# Patient Record
Sex: Female | Born: 1945
Health system: Southern US, Community
[De-identification: ages and names within clinical notes are randomized; demographics above are authoritative.]

## PROBLEM LIST (undated history)

## (undated) DIAGNOSIS — I4891 Unspecified atrial fibrillation: Secondary | ICD-10-CM

## (undated) DIAGNOSIS — T7840XA Allergy, unspecified, initial encounter: Secondary | ICD-10-CM

## (undated) DIAGNOSIS — J4520 Mild intermittent asthma, uncomplicated: Secondary | ICD-10-CM

## (undated) HISTORY — DX: Mild intermittent asthma, uncomplicated: J45.20

## (undated) HISTORY — DX: Unspecified atrial fibrillation: I48.91

## (undated) HISTORY — DX: Allergy, unspecified, initial encounter: T78.40XA

## (undated) HISTORY — PX: ABDOMINAL HYSTERECTOMY: SHX81

---

## 2001-04-10 ENCOUNTER — Other Ambulatory Visit: Admission: RE | Admit: 2001-04-10 | Discharge: 2001-04-10 | Payer: Self-pay | Admitting: Obstetrics & Gynecology

## 2002-05-13 ENCOUNTER — Other Ambulatory Visit: Admission: RE | Admit: 2002-05-13 | Discharge: 2002-05-13 | Payer: Self-pay | Admitting: Obstetrics & Gynecology

## 2003-05-26 ENCOUNTER — Other Ambulatory Visit: Admission: RE | Admit: 2003-05-26 | Discharge: 2003-05-26 | Payer: Self-pay | Admitting: Obstetrics & Gynecology

## 2004-06-29 ENCOUNTER — Other Ambulatory Visit: Admission: RE | Admit: 2004-06-29 | Discharge: 2004-06-29 | Payer: Self-pay | Admitting: Obstetrics & Gynecology

## 2005-08-02 ENCOUNTER — Other Ambulatory Visit: Admission: RE | Admit: 2005-08-02 | Discharge: 2005-08-02 | Payer: Self-pay | Admitting: Obstetrics & Gynecology

## 2005-08-16 ENCOUNTER — Ambulatory Visit: Payer: Self-pay | Admitting: Internal Medicine

## 2005-08-30 ENCOUNTER — Ambulatory Visit: Payer: Self-pay | Admitting: Internal Medicine

## 2006-10-26 ENCOUNTER — Ambulatory Visit: Payer: Self-pay | Admitting: Family Medicine

## 2008-01-10 ENCOUNTER — Ambulatory Visit: Payer: Self-pay | Admitting: Family Medicine

## 2009-12-28 ENCOUNTER — Ambulatory Visit: Payer: Self-pay | Admitting: Family Medicine

## 2009-12-28 ENCOUNTER — Encounter: Admission: RE | Admit: 2009-12-28 | Discharge: 2009-12-28 | Payer: Self-pay | Admitting: Family Medicine

## 2010-01-12 ENCOUNTER — Ambulatory Visit: Payer: Self-pay | Admitting: Family Medicine

## 2010-08-19 ENCOUNTER — Ambulatory Visit (INDEPENDENT_AMBULATORY_CARE_PROVIDER_SITE_OTHER): Payer: BC Managed Care – PPO | Admitting: Family Medicine

## 2010-08-19 DIAGNOSIS — J209 Acute bronchitis, unspecified: Secondary | ICD-10-CM

## 2011-09-26 ENCOUNTER — Ambulatory Visit (INDEPENDENT_AMBULATORY_CARE_PROVIDER_SITE_OTHER): Payer: BC Managed Care – PPO | Admitting: Family Medicine

## 2011-09-26 ENCOUNTER — Encounter: Payer: Self-pay | Admitting: Family Medicine

## 2011-09-26 DIAGNOSIS — J301 Allergic rhinitis due to pollen: Secondary | ICD-10-CM

## 2011-09-26 DIAGNOSIS — J019 Acute sinusitis, unspecified: Secondary | ICD-10-CM

## 2011-09-26 DIAGNOSIS — J45901 Unspecified asthma with (acute) exacerbation: Secondary | ICD-10-CM

## 2011-09-26 MED ORDER — ALBUTEROL SULFATE HFA 108 (90 BASE) MCG/ACT IN AERS: 2.0000 | INHALATION_SPRAY | Freq: Four times a day (QID) | RESPIRATORY_TRACT | Status: DC | PRN

## 2011-09-26 MED ORDER — AMOXICILLIN-POT CLAVULANATE 875-125 MG PO TABS: 1.0000 | ORAL_TABLET | Freq: Two times a day (BID) | ORAL | Status: AC

## 2011-09-26 NOTE — Progress Notes (Signed)
  Subjective:    Patient ID: Alexandria [name], female    DOB: 08-26-1945, 66 y.o.   MRN: [id number]  HPI Approximately 3 weeks ago she started having difficulty with sneezing, itchy watery eyes and rhinorrhea. 2 weeks ago she noted the onset of left earache and was seen in an urgent care center. She was given azithromycin which did not help. She continues to have difficulty with bilateral earache, cough, PND, slight sore throat with chest congestion she has a previous history of asthma and has been seen by an allergist and did have skin testing done. She does not smoke.   Review of Systems     Objective:   Physical Exam alert and in no distress. Tympanic membranes and canals are normal. Throat is clear. Tonsils are normal. Neck is supple without adenopathy or thyromegaly. Cardiac exam shows a regular sinus rhythm without murmurs or gallops. Lungs are clear to auscultation. His enclosed is boggy but nontender sinuses        Assessment & Plan:   1. Acute sinusitis  amoxicillin-clavulanate (AUGMENTIN) 875-125 MG per tablet  2. Allergic rhinitis due to pollen    3. Asthma flare  albuterol (PROVENTIL HFA) 108 (90 BASE) MCG/ACT inhaler   she will call if continued difficulty.

## 2011-09-26 NOTE — Patient Instructions (Signed)
Take all of the antibiotic and if not totally back to normal when you finish, give me a call. If your allergies give you more trouble , set up an appointment for further care.

## 2011-09-28 ENCOUNTER — Telehealth: Payer: Self-pay | Admitting: Family Medicine

## 2011-09-28 NOTE — Telephone Encounter (Signed)
PT CALLED AND STATED THAT RX SHE WAS GIVEN ON Monday IS REALLY UPSETTING HER STOMACH. SHE DOESN'T WANT TO TAKE ANYMORE, SHE WOULD LIKE SOMETHING ELSE CALLED IN. PT ALSO STATES THAT HER EAR IS ITCHING AND SHE WOULD LIKE TO PUT PEROXIDE IN HER EAR. SHE WOULD LIKE TO KNOW  IF THAT IS OK. PLEASE CALL PT. PT USES [facility] ON [address].

## 2011-09-29 MED ORDER — LEVOFLOXACIN 500 MG PO TABS: 500.0000 mg | ORAL_TABLET | Freq: Every day | ORAL | Status: AC

## 2011-09-29 NOTE — Telephone Encounter (Signed)
I called and lmom for the patient about her medication change and the peroxide in her ear. CLS

## 2011-09-29 NOTE — Telephone Encounter (Signed)
N: The different medication. Let her know that proximal I probably won't do any good

## 2011-09-29 NOTE — Telephone Encounter (Signed)
Patient switched to Levaquin due to GI upset

## 2011-09-29 NOTE — Telephone Encounter (Signed)
Alexandria [name] please handle

## 2013-12-23 ENCOUNTER — Encounter: Payer: Self-pay | Admitting: Family Medicine

## 2013-12-23 ENCOUNTER — Ambulatory Visit (INDEPENDENT_AMBULATORY_CARE_PROVIDER_SITE_OTHER): Payer: Medicare Other | Admitting: Family Medicine

## 2013-12-23 VITALS — BP 120/70 | HR 100 | Temp 98.7°F | Wt 163.0 lb

## 2013-12-23 DIAGNOSIS — J029 Acute pharyngitis, unspecified: Secondary | ICD-10-CM

## 2013-12-23 DIAGNOSIS — H6120 Impacted cerumen, unspecified ear: Secondary | ICD-10-CM

## 2013-12-23 NOTE — Progress Notes (Deleted)
   Subjective:    Patient ID: Alexandria [name], female    DOB: [birth year], 68 y.o.   MRN: [id number]  HPI    Review of Systems     Objective:   Physical Exam        Assessment & Plan:

## 2013-12-23 NOTE — Progress Notes (Signed)
   Subjective:    Patient ID: Alexandria [name], female    DOB: 05/08/1946, 68 y.o.   MRN: [id number]  HPI  Patient is presenting for 5 day history of throat and left ear popping. Associated symptoms include dry cough, wheezing, feeling feverish (99.50F this morning), pain in her back and shoulder, feels uncomfortable breathing. She has a previous history of difficulty with asthma. She denies ear drainage, decreased hearing, ear pain, sinus pressure, congestion, headaches, myalgias, nausea, vomiting, diarrhea, abdominal pain, chest pain, difficulty swallowing, hoarse voice.   For her left ear, patient has tried to use peroxide and sweet oil with a cotton swab her left ear without symptom relief. For her throat she tried Advil without symptom relief. For her breathing, she has not tried any interventions including her albuterol.   Of note, she has had sick contacts at a music camp with children last week. She also admits having had a history of bronchitis and pneumonia which she struggled to resolve.  She denies any other questions or concerns.  Review of Systems As in subjective.     Objective:   Physical Exam  BP 120/70  Pulse 100  Temp(Src) 98.7 F (37.1 C) (Oral)  Wt 163 lb (73.936 kg)  SpO2 98%  General appearence: alert, no distress, WD/WN HEENT: normocephalic, sclerae anicteric, conjunctiva pink and moist, TMs pearly and intact, TMs non-erythematous without drainage or bulging, mild cerumen impaction in left ear, nares patent, no discharge or erythema, pharynx with mild post-nasal drip, tonsils absent (s/p tonsillectomy) Oral cavity: MMM, no lesions Neck: supple, no lymphadenopathy, no thyromegaly, no masses Heart: RRR, normal S1, S2, no murmurs Lungs: CTA bilaterally, no wheezes, rhonchi, or rales       Assessment & Plan:   Acute pharyngitis, unspecified pharyngitis type - Plan: Rapid Strep A Rapid strep was negative. Advised rest, fluids and hydration. Return if symptoms  do not resolve or worsen in the next few days.  Cerumen impaction in left ear - irrigated and manually removed from a left and right ear canals  Patient was seen in conjunction with PA student Alexandria [name], and I have also evaluated and examined patient, agree with student's notes, student supervised by me.

## 2014-01-06 LAB — HM DEXA SCAN: HM DEXA SCAN: NORMAL

## 2015-06-10 LAB — HM MAMMOGRAPHY

## 2015-08-21 ENCOUNTER — Encounter: Payer: Self-pay | Admitting: Gastroenterology

## 2015-09-23 ENCOUNTER — Telehealth: Payer: Self-pay | Admitting: Family Medicine

## 2015-09-23 NOTE — Telephone Encounter (Signed)
Left message for pt to call/ needs cpe

## 2015-10-18 DIAGNOSIS — J00 Acute nasopharyngitis [common cold]: Secondary | ICD-10-CM | POA: Diagnosis not present

## 2015-10-18 DIAGNOSIS — J069 Acute upper respiratory infection, unspecified: Secondary | ICD-10-CM | POA: Diagnosis not present

## 2016-01-05 DIAGNOSIS — Z1272 Encounter for screening for malignant neoplasm of vagina: Secondary | ICD-10-CM | POA: Diagnosis not present

## 2016-01-05 DIAGNOSIS — Z01419 Encounter for gynecological examination (general) (routine) without abnormal findings: Secondary | ICD-10-CM | POA: Diagnosis not present

## 2016-01-05 DIAGNOSIS — Z6827 Body mass index (BMI) 27.0-27.9, adult: Secondary | ICD-10-CM | POA: Diagnosis not present

## 2016-01-12 ENCOUNTER — Encounter: Payer: Self-pay | Admitting: Gastroenterology

## 2016-02-01 ENCOUNTER — Ambulatory Visit (INDEPENDENT_AMBULATORY_CARE_PROVIDER_SITE_OTHER): Payer: Medicare Other | Admitting: Family Medicine

## 2016-02-01 ENCOUNTER — Encounter: Payer: Self-pay | Admitting: Family Medicine

## 2016-02-01 VITALS — BP 122/80 | HR 80 | Ht 63.5 in | Wt 162.0 lb

## 2016-02-01 DIAGNOSIS — J301 Allergic rhinitis due to pollen: Secondary | ICD-10-CM

## 2016-02-01 DIAGNOSIS — Z1159 Encounter for screening for other viral diseases: Secondary | ICD-10-CM | POA: Diagnosis not present

## 2016-02-01 DIAGNOSIS — Z139 Encounter for screening, unspecified: Secondary | ICD-10-CM | POA: Diagnosis not present

## 2016-02-01 DIAGNOSIS — J4520 Mild intermittent asthma, uncomplicated: Secondary | ICD-10-CM

## 2016-02-01 DIAGNOSIS — Z79890 Hormone replacement therapy (postmenopausal): Secondary | ICD-10-CM | POA: Diagnosis not present

## 2016-02-01 DIAGNOSIS — E785 Hyperlipidemia, unspecified: Secondary | ICD-10-CM | POA: Insufficient documentation

## 2016-02-01 DIAGNOSIS — Z23 Encounter for immunization: Secondary | ICD-10-CM

## 2016-02-01 DIAGNOSIS — Z1211 Encounter for screening for malignant neoplasm of colon: Secondary | ICD-10-CM

## 2016-02-01 DIAGNOSIS — Z0000 Encounter for general adult medical examination without abnormal findings: Secondary | ICD-10-CM | POA: Diagnosis not present

## 2016-02-01 HISTORY — DX: Mild intermittent asthma, uncomplicated: J45.20

## 2016-02-01 LAB — COMPREHENSIVE METABOLIC PANEL
ALT: 16 U/L (ref 6–29)
AST: 18 U/L (ref 10–35)
Albumin: 3.9 g/dL (ref 3.6–5.1)
Alkaline Phosphatase: 47 U/L (ref 33–130)
BILIRUBIN TOTAL: 0.5 mg/dL (ref 0.2–1.2)
BUN: 18 mg/dL (ref 7–25)
CALCIUM: 9.2 mg/dL (ref 8.6–10.4)
CHLORIDE: 102 mmol/L (ref 98–110)
CO2: 27 mmol/L (ref 20–31)
Creat: 0.77 mg/dL (ref 0.60–0.93)
GLUCOSE: 83 mg/dL (ref 65–99)
Potassium: 4.7 mmol/L (ref 3.5–5.3)
Sodium: 138 mmol/L (ref 135–146)
Total Protein: 6.6 g/dL (ref 6.1–8.1)

## 2016-02-01 LAB — LIPID PANEL
CHOL/HDL RATIO: 4 ratio (ref <=5.0)
Cholesterol: 205 mg/dL — ABNORMAL HIGH (ref 125–200)
HDL: 51 mg/dL (ref >=46)
LDL CALC: 135 mg/dL — AB (ref <130)
TRIGLYCERIDES: 93 mg/dL (ref <150)
VLDL: 19 mg/dL (ref <30)

## 2016-02-01 LAB — CBC WITH DIFFERENTIAL/PLATELET
BASOS ABS: 0 {cells}/uL (ref 0–200)
BASOS PCT: 0 %
EOS ABS: 180 {cells}/uL (ref 15–500)
EOS PCT: 3 %
HEMATOCRIT: 45.3 % — AB (ref 35.0–45.0)
Hemoglobin: 15.4 g/dL (ref 11.7–15.5)
LYMPHS ABS: 2040 {cells}/uL (ref 850–3900)
MCH: 29.9 pg (ref 27.0–33.0)
MCHC: 34 g/dL (ref 32.0–36.0)
MCV: 88 fL (ref 80.0–100.0)
MPV: 9.7 fL (ref 7.5–12.5)
Monocytes Absolute: 360 cells/uL (ref 200–950)
Monocytes Relative: 6 %
NEUTROS PCT: 57 %
Neutro Abs: 3420 cells/uL (ref 1500–7800)
Platelets: 287 10*3/uL (ref 140–400)
RBC: 5.15 MIL/uL — ABNORMAL HIGH (ref 3.80–5.10)
RDW: 13.3 % (ref 11.0–15.0)
WBC: 6 10*3/uL (ref 4.0–10.5)

## 2016-02-01 NOTE — Progress Notes (Signed)
Subjective:   HPI  Alexandria [name] is a 70 y.o. female who presents for a complete physical.  Medical care team includes:  Dr.Neal  Dr.Kraska   Preventative care Last ophthalmology visit: dec 2016 [facility] Last dental visit: 2/17 02/11/16 Last colonoscopy: 08/20/2005 Last mammogram:06/10/15 Last gynecological exam: 7/17 Last EKG:?Several years ago Last labs: 2015  Prior vaccinations: TD or Tdap:allergies and she apparently had an adverse reaction as a child. Influenza: 9/16 Pneumococcal: 23:2012 Shingles/Zostavax: had the shot   Advanced directive: Yes Health care power of attorney: Yes  Concerns:She complains of a year-long history of coughing that has not gotten worse. She does note that when she lies down or gets up, she will have more trouble with hiccups She also coughs but cannot relate these to the hiccups. She does have underlying allergies and is taking an OTC medication for this. She also complains of difficulty hearing. She is on HRT from her gynecologist. She does occasionally have difficulty with asthma and does use an albuterol inhaler on an as-needed basis.  Reviewed their medical, surgical, family, social, medication, and allergy history and updated chart as appropriate.    Review of Systems Constitutional: -fever, -chills, -sweats, -unexpected weight change, -decreased appetite, -fatigue Allergy: --congestion Dermatology: -changing moles, --rash, -lumps ENT: -runny nose, -ear pain, -sore throat, -hoarseness, -sinus pain, -teeth pain, - ringing in ears, -hearing loss, -nosebleeds Cardiology: -chest pain, -palpitations, -swelling, -difficulty breathing when lying flat, -waking up short of breath Respiratory: -cough, -shortness of breath, -difficulty breathing with exercise or exertion, -wheezing, -coughing up blood Gastroenterology: -abdominal pain, -nausea, -vomiting, -diarrhea, -constipation, -blood in stool, -changes in bowel movement, -difficulty swallowing  or eating Hematology: -bleeding, -bruising  Musculoskeletal: -joint aches, -muscle aches, -joint swelling, -back pain, -neck pain, -cramping, -changes in gait Ophthalmology: denies vision changes, eye redness, itching, discharge Urology: -burning with urination, -difficulty urinating, -blood in urine, -urinary frequency, -urgency, -incontinence Neurology: -headache, -weakness, -tingling, -numbness, -memory loss, -falls, -dizziness Psychology: -depressed mood, -agitation, -sleep problems     Objective:   Physical Exam General appearance: alert, no distress, WD/WN,  Skin: Normal HEENT: normocephalic, conjunctiva/corneas normal, sclerae anicteric, PERRLA, EOMi, nares patent, no discharge or erythema, pharynx normal. Both ears were lavaged to remove all cerumen. Oral cavity: MMM, tongue normal, teeth normal Neck: supple, no lymphadenopathy, no thyromegaly, no masses, normal ROM Chest: non tender, normal shape and expansion Heart: RRR, normal S1, S2, no murmurs Lungs: CTA bilaterally, no wheezes, rhonchi, or rales Abdomen: +bs, soft, non tender, non distended, no masses, no hepatomegaly, no splenomegaly, no bruits Back: non tender, normal ROM, no scoliosis Musculoskeletal: upper extremities non tender, no obvious deformity, normal ROM throughout, lower extremities non tender, no obvious deformity, normal ROM throughout Extremities: no edema, no cyanosis, no clubbing Pulses: 2+ symmetric, upper and lower extremities, normal cap refill Neurological: alert, oriented x 3, CN2-12 intact, strength normal upper extremities and lower extremities, sensation normal throughout, DTRs 2+ throughout, no cerebellar signs, gait normal Psychiatric: normal affect, behavior normal, pleasant  Breast: nontender, no masses or lumps, no skin changes, no nipple discharge or inversion, no axillary lymphadenopathy    Assessment and Plan :   Non-seasonal allergic rhinitis due to pollen - Plan: CBC with  Differential/Platelet, Comprehensive metabolic panel  Asthma, mild intermittent, uncomplicated - Plan: CBC with Differential/Platelet, Comprehensive metabolic panel  Need for prophylactic vaccination against Streptococcus pneumoniae (pneumococcus) - Plan: Pneumococcal conjugate vaccine 13-valent  Screening for colon cancer - Plan: CBC with Differential/Platelet, Comprehensive metabolic panel, Cologuard  Borderline hyperlipidemia -  Plan: CBC with Differential/Platelet, Comprehensive metabolic panel, Lipid panel  Hormone replacement therapy (HRT)  Need for hepatitis C screening test - Plan: Hepatitis C antibody Cologuard ordered as well.     Physical exam - discussed healthy lifestyle, diet, exercise, preventative care, vaccinations, and addressed their concerns.   Follow-up as needed

## 2016-02-02 DIAGNOSIS — M21611 Bunion of right foot: Secondary | ICD-10-CM | POA: Diagnosis not present

## 2016-02-02 DIAGNOSIS — M2041 Other hammer toe(s) (acquired), right foot: Secondary | ICD-10-CM | POA: Diagnosis not present

## 2016-02-02 LAB — HEPATITIS C ANTIBODY: HCV Ab: NEGATIVE

## 2016-02-08 ENCOUNTER — Encounter: Payer: Self-pay | Admitting: Family Medicine

## 2016-02-09 DIAGNOSIS — Z1212 Encounter for screening for malignant neoplasm of rectum: Secondary | ICD-10-CM | POA: Diagnosis not present

## 2016-02-09 DIAGNOSIS — Z1211 Encounter for screening for malignant neoplasm of colon: Secondary | ICD-10-CM | POA: Diagnosis not present

## 2016-02-19 LAB — COLOGUARD: Cologuard: NEGATIVE

## 2016-03-15 ENCOUNTER — Ambulatory Visit (INDEPENDENT_AMBULATORY_CARE_PROVIDER_SITE_OTHER): Payer: Medicare Other | Admitting: Medical

## 2016-03-15 ENCOUNTER — Ambulatory Visit
Admission: RE | Admit: 2016-03-15 | Discharge: 2016-03-15 | Disposition: A | Discharge status: Home / Self Care | Payer: Medicare Other | Source: Ambulatory Visit | Attending: Medical | Admitting: Medical

## 2016-03-15 VITALS — BP 122/82 | HR 72 | Temp 97.8°F | Ht 63.5 in | Wt 157.6 lb

## 2016-03-15 DIAGNOSIS — R059 Cough, unspecified: Secondary | ICD-10-CM | POA: Insufficient documentation

## 2016-03-15 DIAGNOSIS — Z23 Encounter for immunization: Secondary | ICD-10-CM | POA: Insufficient documentation

## 2016-03-15 DIAGNOSIS — R918 Other nonspecific abnormal finding of lung field: Secondary | ICD-10-CM | POA: Insufficient documentation

## 2016-03-15 DIAGNOSIS — R05 Cough: Secondary | ICD-10-CM | POA: Diagnosis not present

## 2016-03-15 MED ORDER — ALBUTEROL SULFATE HFA 108 (90 BASE) MCG/ACT IN AERS: 2.0000 | INHALATION_SPRAY | Freq: Four times a day (QID) | RESPIRATORY_TRACT | 0 refills | Status: DC | PRN

## 2016-03-15 MED ORDER — HYDROCODONE-HOMATROPINE 5-1.5 MG/5ML PO SYRP: 5.0000 mL | ORAL_SOLUTION | Freq: Three times a day (TID) | ORAL | 0 refills | Status: DC | PRN

## 2016-03-15 MED ORDER — LEVOFLOXACIN 500 MG PO TABS: 500.0000 mg | ORAL_TABLET | Freq: Every day | ORAL | 0 refills | Status: DC

## 2016-03-15 NOTE — Progress Notes (Signed)
Subjective:  Alexandria [name] is a 70 y.o. female who presents for cough, congestion.   She reports almost a week history lots of cough, congestion, and dentist gave her Zpak for illness.   Hasn't slept flat all week due to cough, been using sudafed as well, nasal saline.   Blowing nose constantly, coughing a lots. Thinks she has had bronchitis.  No fever, no sore throat, no ear pain, no teeth pain related to sinuses.  Has felt some wheezing, some SOB.   Had similar in the spring, thinks it started with allergies, but this quickly turned into sickness.   Patient is not a smoker.   No recent sick contacts.  No other aggravating or relieving factors.  No other c/o.  The following portions of the patient's history were reviewed and updated as appropriate: allergies, current medications, past family history, past medical history, past social history, past surgical history and problem list.  ROS as in subjective  Past Medical History:  Diagnosis Date  . Allergy   . Asthma, mild intermittent 02/01/2016   Current Outpatient Prescriptions on File Prior to Visit  Medication Sig Dispense Refill  . Biotin 5000 MCG CAPS Take by mouth.    . calcium carbonate (OSCAL) 1500 (600 Ca) MG TABS tablet Take 600 mg of elemental calcium by mouth 2 (two) times daily with a meal.    . cholecalciferol (VITAMIN D) 1000 units tablet Take 2,000 Units by mouth daily.    Marland Kitchen. estradiol (VIVELLE-DOT) 0.1 MG/24HR patch Place 1 patch onto the skin once a week.     . pyridOXINE (VITAMIN B-6) 100 MG tablet Take 100 mg by mouth daily.    . vitamin C (ASCORBIC ACID) 500 MG tablet Take 500 mg by mouth daily.     No current facility-administered medications on file prior to visit.      Objective: BP 122/82 (BP Location: Right Arm, Patient Position: Sitting, Cuff Size: Normal)   Pulse 72   Temp 97.8 F (36.6 C) (Oral)   Ht 5' 3.5" (1.613 m)   Wt 157 lb 9.6 oz (71.5 kg)   SpO2 98%   BMI 27.48 kg/m   General appearance:  Alert, WD/WN, no distress,somewhat ill appearing                             Skin: warm, no rash, no diaphoresis                           Head: no sinus tenderness                            Eyes: conjunctiva normal, corneas clear, PERRLA                            Ears: pearly TMs, external ear canals normal                          Nose: septum midline, turbinates swollen, with erythema and clear discharge             Mouth/throat: MMM, tongue normal, mild pharyngeal erythema                           Neck: supple, no adenopathy, no thyromegaly, nontender  Heart: RRR, normal S1, S2, no murmurs                         Lungs: +bronchial breath sounds, decreased right lower field sounds, dullness, but no wheezes, no rales                Extremities: no edema, nontender      Assessment: Encounter Diagnoses  Name Primary?  . Cough Yes  . Abnormal lung field   . Need for prophylactic vaccination and inoculation against influenza      Plan:  Discussed exam findings.   Will send for xray.   Can use Hycodan syrup for worse cough, begin albuterol, Levaquin sent.  Rest, hydrate well.    F/u pending xray Counseled on the influenza virus vaccine.  Vaccine information sheet given.  Influenza vaccine given after consent obtained.  Troy was seen today for cough.  Diagnoses and all orders for this visit:  Cough -     DG Chest 2 View; Future -     Pulse oximetry (single); Future  Abnormal lung field -     DG Chest 2 View; Future -     Pulse oximetry (single); Future  Need for prophylactic vaccination and inoculation against influenza -     Flu Vaccine QUAD 36+ mos IM  Other orders -     HYDROcodone-homatropine (HYCODAN) 5-1.5 MG/5ML syrup; Take 5 mLs by mouth every 8 (eight) hours as needed for cough. -     albuterol (PROVENTIL HFA;VENTOLIN HFA) 108 (90 Base) MCG/ACT inhaler; Inhale 2 puffs into the lungs every 6 (six) hours as needed for wheezing or shortness  of breath. -     levofloxacin (LEVAQUIN) 500 MG tablet; Take 1 tablet (500 mg total) by mouth daily.

## 2016-03-21 ENCOUNTER — Encounter: Payer: Self-pay | Admitting: Gastroenterology

## 2016-05-27 DIAGNOSIS — H349 Unspecified retinal vascular occlusion: Secondary | ICD-10-CM | POA: Diagnosis not present

## 2016-05-27 DIAGNOSIS — H25043 Posterior subcapsular polar age-related cataract, bilateral: Secondary | ICD-10-CM | POA: Diagnosis not present

## 2016-06-09 DIAGNOSIS — Z1231 Encounter for screening mammogram for malignant neoplasm of breast: Secondary | ICD-10-CM | POA: Diagnosis not present

## 2016-06-09 DIAGNOSIS — Z803 Family history of malignant neoplasm of breast: Secondary | ICD-10-CM | POA: Diagnosis not present

## 2016-06-09 LAB — HM MAMMOGRAPHY

## 2016-06-15 ENCOUNTER — Encounter: Payer: Self-pay | Admitting: Family Medicine

## 2016-06-30 DIAGNOSIS — H25041 Posterior subcapsular polar age-related cataract, right eye: Secondary | ICD-10-CM | POA: Diagnosis not present

## 2016-06-30 DIAGNOSIS — H25811 Combined forms of age-related cataract, right eye: Secondary | ICD-10-CM | POA: Diagnosis not present

## 2016-06-30 DIAGNOSIS — H268 Other specified cataract: Secondary | ICD-10-CM | POA: Diagnosis not present

## 2016-07-14 DIAGNOSIS — H25042 Posterior subcapsular polar age-related cataract, left eye: Secondary | ICD-10-CM | POA: Diagnosis not present

## 2016-07-14 DIAGNOSIS — H268 Other specified cataract: Secondary | ICD-10-CM | POA: Diagnosis not present

## 2016-07-14 DIAGNOSIS — H25812 Combined forms of age-related cataract, left eye: Secondary | ICD-10-CM | POA: Diagnosis not present

## 2016-07-15 ENCOUNTER — Encounter: Payer: Self-pay | Admitting: Family Medicine

## 2016-07-15 ENCOUNTER — Ambulatory Visit (INDEPENDENT_AMBULATORY_CARE_PROVIDER_SITE_OTHER): Payer: Medicare Other | Admitting: Family Medicine

## 2016-07-15 VITALS — BP 120/70 | HR 74 | Temp 98.1°F

## 2016-07-15 DIAGNOSIS — M545 Low back pain, unspecified: Secondary | ICD-10-CM

## 2016-07-15 MED ORDER — CYCLOBENZAPRINE HCL 5 MG PO TABS: 5.0000 mg | ORAL_TABLET | Freq: Every day | ORAL | 0 refills | Status: DC

## 2016-07-15 MED ORDER — CYCLOBENZAPRINE HCL 5 MG PO TABS
5.0000 mg | ORAL_TABLET | Freq: Every day | ORAL | 0 refills | Status: DC
Start: 2016-07-15 — End: 2017-03-24

## 2016-07-15 NOTE — Patient Instructions (Signed)
Keep using heat and doing some gentle stretches. Take the flexeril at bedtime as needed but use caution as this medication will make you sleepy.

## 2016-07-15 NOTE — Progress Notes (Signed)
   Subjective:    Patient ID: Alexandria [name], female    DOB: 1945-07-01, 71 y.o.   MRN: [id number]  HPI Chief Complaint  Patient presents with  . back pain    back pain- last friday, cleaning bathroom and thinks she twisted cleaning. been taking ibuprofen and its not touching it, wants a muscle relaxer   She is here with complaints of a 6 day history of left low back pain that started after cleaning her bathroom and being bent over for several minutes. She also lifted something heavy that she thinks made the pain worse. Pain is non radiating. Worse with going from sitting to standing. Nothing makes pain better.  She has used heat and tylenol 500 mg and ibuprofen 400 mg.  She had cataract surgery yesterday and states she cannot take as much ibuprofen.   Denies numbness, tingling, weakness. No loss of control of bowels or bladder. Denies fever, chills, abdominal pain, nausea, vomiting, diarrhea.   Thinks she is also getting a cold since yesterday she has had rhinorrhea and some nasal congestion.   Reviewed allergies, medications, past medical, and social history.    Review of Systems Pertinent positives and negatives in the history of present illness.     Objective:   Physical Exam  Constitutional: She is oriented to person, place, and time. She appears well-developed and well-nourished. No distress.  Musculoskeletal:       Lumbar back: She exhibits tenderness and pain. She exhibits normal range of motion and no swelling.       Back:  Normal lumbar curvature. Tenderness over left sciatic notch. No erythema, edema. Normal ROM. LE are neurovascularly intact. Negative straight leg raise.   Neurological: She is alert and oriented to person, place, and time. She has normal strength and normal reflexes. Gait normal.  Skin: Skin is warm and dry. No pallor.   BP 120/70   Pulse 74   Temp 98.1 F (36.7 C) (Oral)       Assessment & Plan:  Acute left-sided low back pain without  sciatica  Flexeril prescribed for back pain. Patient states she has taken this in the past without difficulty. Discussed possible side effects. Since she cannot take ibuprofen due to recent surgery I recommend tylenol.  Recommend she use heat and do gentle stretches. She will follow up if not improving or if it worsens.

## 2016-07-18 ENCOUNTER — Telehealth: Payer: Self-pay | Admitting: Family Medicine

## 2016-07-18 NOTE — Telephone Encounter (Signed)
Pt called and states that she needs something else besides the flexeril states that it is not doing her any good, states she has no relief, she is using the heating pad and the icy hot and is taking the ibuopfren, but she come in to get a muscle releaxer and did not get one, pt can be reached at [phone] and [facility] [address], [place] - [id number]

## 2016-07-18 NOTE — Telephone Encounter (Signed)
Spoke with pt and made her aware of Vickie's recommendation. She said "well I know its not going to help, I don't feel like you addressed my concerns." Advised pt that we did address her concerns and that if the 5mg  did not help her then she needed to try 10mg  and call us back. Pt was not satisfied with this answer and said "well I should have went somewhere else." She then disconnected the call.   Forwarding to Lafonda MossesDiana d/t to pt concern.

## 2016-07-18 NOTE — Telephone Encounter (Signed)
She can double up and take 10 mg of the Flexeril and see if this helps. Let me know.

## 2016-07-28 ENCOUNTER — Encounter: Payer: Self-pay | Admitting: Family Medicine

## 2016-07-28 ENCOUNTER — Ambulatory Visit (INDEPENDENT_AMBULATORY_CARE_PROVIDER_SITE_OTHER): Payer: Medicare Other | Admitting: Family Medicine

## 2016-07-28 VITALS — BP 110/60 | HR 75 | Wt 165.0 lb

## 2016-07-28 DIAGNOSIS — M545 Low back pain, unspecified: Secondary | ICD-10-CM

## 2016-07-28 NOTE — Patient Instructions (Signed)
4 ibuprofen 3 times per day. Heat for 20 minutes 3 times per day and stretching after that. To that for the next 10 days to 2 weeks

## 2016-07-28 NOTE — Progress Notes (Signed)
   Subjective:    Patient ID: Alexandria [name], female    DOB: 02-28-1946, 71 y.o.   MRN: [id number]  HPI Toxically 2 weeks ago she was cleaning and in the bent position for an extended period of time. The next day she had some low back pain slightly more on the left side than the right. She did use ibuprofen as well as heat but not in maximum dosing. No numbness, tingling or weakness. She was seen January 25 and given a muscle relaxer which has not helped.   Review of Systems     Objective:   Physical Exam Normal lumbar motion and curve. Tender just medial to the left SI joint. Hip motion is normal. Negative straight leg raising. Normal DTRs.       Assessment & Plan:  Acute midline low back pain without sciatica I will treat her conservatively with heat and stretching as well as 800 mg ibuprofen 3 times per day. Discussed follow-up and possible referral to physical therapy if continued difficulty.

## 2016-08-03 ENCOUNTER — Encounter: Payer: Self-pay | Admitting: Family Medicine

## 2016-08-23 ENCOUNTER — Ambulatory Visit (INDEPENDENT_AMBULATORY_CARE_PROVIDER_SITE_OTHER): Payer: Medicare Other | Admitting: Family Medicine

## 2016-08-23 ENCOUNTER — Encounter: Payer: Self-pay | Admitting: Family Medicine

## 2016-08-23 VITALS — BP 120/80 | HR 71 | Temp 98.5°F | Wt 166.4 lb

## 2016-08-23 DIAGNOSIS — J301 Allergic rhinitis due to pollen: Secondary | ICD-10-CM | POA: Diagnosis not present

## 2016-08-23 DIAGNOSIS — J4521 Mild intermittent asthma with (acute) exacerbation: Secondary | ICD-10-CM

## 2016-08-23 MED ORDER — LEVOFLOXACIN 500 MG PO TABS
500.0000 mg | ORAL_TABLET | Freq: Every day | ORAL | 0 refills | Status: DC
Start: 2016-08-23 — End: 2016-08-31

## 2016-08-23 MED ORDER — ALBUTEROL SULFATE HFA 108 (90 BASE) MCG/ACT IN AERS: 2.0000 | INHALATION_SPRAY | Freq: Four times a day (QID) | RESPIRATORY_TRACT | 0 refills | Status: DC | PRN

## 2016-08-23 NOTE — Patient Instructions (Signed)
Use the albuterol 2 puffs 4 times per day and you can also take NyQuil at night. Take all the antibiotic and if not totally back to normal when you finish give me a call

## 2016-08-23 NOTE — Progress Notes (Signed)
   Subjective:    Patient ID: Alexandria [name], female    DOB: [birth year], 71 y.o.   MRN: [id number]  HPI 3 days ago she started having difficulty with a cough that has become deeper but not productive, slightly dry throat, left ear congestion but no fever, chills or earache. She has occasionally used her albuterol. She does have underlying allergies and does have difficulty with asthma during the allergy season. She does not smoke. The symptoms are very similar to previous episode that required the use of Levaquin.   Review of Systems     Objective:   Physical Exam Alert and in no distress. Tympanic membranes and canals are normal. Pharyngeal area is normal. Neck is supple without adenopathy or thyromegaly. Cardiac exam shows a regular sinus rhythm without murmurs or gallops. Lungs are clear to auscultation.        Assessment & Plan:  Mild intermittent asthmatic bronchitis with acute exacerbation - Plan: levofloxacin (LEVAQUIN) 500 MG tablet, albuterol (PROVENTIL HFA;VENTOLIN HFA) 108 (90 Base) MCG/ACT inhaler  Non-seasonal allergic rhinitis due to pollen, unspecified chronicity Aches or symptoms are more indicative of a trivial infection and I would have to agree. She will call if she has further difficulties.

## 2016-08-31 ENCOUNTER — Other Ambulatory Visit: Payer: Self-pay | Admitting: Family Medicine

## 2016-08-31 DIAGNOSIS — J4521 Mild intermittent asthma with (acute) exacerbation: Secondary | ICD-10-CM

## 2016-09-01 NOTE — Telephone Encounter (Signed)
Is this okay to refill? 

## 2017-01-31 DIAGNOSIS — H02403 Unspecified ptosis of bilateral eyelids: Secondary | ICD-10-CM | POA: Diagnosis not present

## 2017-03-24 ENCOUNTER — Ambulatory Visit (INDEPENDENT_AMBULATORY_CARE_PROVIDER_SITE_OTHER): Payer: Medicare Other | Admitting: Medical

## 2017-03-24 ENCOUNTER — Encounter: Payer: Self-pay | Admitting: Medical

## 2017-03-24 VITALS — BP 134/80 | HR 80 | Temp 98.1°F | Wt 158.4 lb

## 2017-03-24 DIAGNOSIS — J4521 Mild intermittent asthma with (acute) exacerbation: Secondary | ICD-10-CM | POA: Diagnosis not present

## 2017-03-24 DIAGNOSIS — J4540 Moderate persistent asthma, uncomplicated: Secondary | ICD-10-CM | POA: Diagnosis not present

## 2017-03-24 DIAGNOSIS — J301 Allergic rhinitis due to pollen: Secondary | ICD-10-CM

## 2017-03-24 MED ORDER — ALBUTEROL SULFATE HFA 108 (90 BASE) MCG/ACT IN AERS: 2.0000 | INHALATION_SPRAY | Freq: Four times a day (QID) | RESPIRATORY_TRACT | 0 refills | Status: DC | PRN

## 2017-03-24 MED ORDER — LEVOFLOXACIN 500 MG PO TABS
500.0000 mg | ORAL_TABLET | Freq: Every day | ORAL | 0 refills | Status: DC
Start: 2017-03-24 — End: 2017-03-30

## 2017-03-24 NOTE — Progress Notes (Signed)
Subjective:  Alexandria [name] is a 71 y.o. female who presents for cough.  For the past 5 days has had headache, lots of coughing, sore throat, burning in chest from all the coughing.   Using sudafed and robitussin.  Using nasal saline, Vicks inhaler.   This is similar to prior flares up here in the past year.   No NVD, no fever.  Denies sick contacts.  Past history is significant for asthma and allergies.  She notes recent watery eyes, blowing nose nonstop. Patient is not a smoker. No other aggravating or relieving factors.  No other c/o.  The following portions of the patient's history were reviewed and updated as appropriate: allergies, current medications, past family history, past medical history, past social history, past surgical history and problem list.  ROS as in subjective  Past Medical History:  Diagnosis Date  . Allergy   . Asthma, mild intermittent 02/01/2016   Current Outpatient Prescriptions on File Prior to Visit  Medication Sig Dispense Refill  . Biotin 5000 MCG CAPS Take by mouth.    . calcium carbonate (OSCAL) 1500 (600 Ca) MG TABS tablet Take 600 mg of elemental calcium by mouth 2 (two) times daily with a meal.    . cholecalciferol (VITAMIN D) 1000 units tablet Take 2,000 Units by mouth daily.    Marland Kitchen estradiol (VIVELLE-DOT) 0.1 MG/24HR patch Place 1 patch onto the skin once a week.     . pyridOXINE (VITAMIN B-6) 100 MG tablet Take 100 mg by mouth daily.    . vitamin C (ASCORBIC ACID) 500 MG tablet Take 500 mg by mouth daily.     No current facility-administered medications on file prior to visit.    ROS as in subjective   Objective: BP 134/80   Pulse 80   Temp 98.1 F (36.7 C)   Wt 158 lb 6.4 oz (71.8 kg)   SpO2 99%   BMI 27.62 kg/m   General appearance: Alert, WD/WN, no distress, somewhat ill appearing                             Skin: warm, no rash, no diaphoresis                           Head: no sinus tenderness                            Eyes:  conjunctiva normal, corneas clear, PERRLA                            Ears: pearly TMs, external ear canals normal                          Nose: septum midline, turbinates swollen, with erythema and clear discharge             Mouth/throat: MMM, tongue normal, mild pharyngeal erythema                           Neck: supple, no adenopathy, no thyromegaly, nontender                          Heart: RRR, normal S1, S2, no murmurs  Lungs: +bronchial breath sounds, +scattered rhonchi, no wheezes, no rales                Extremities: no edema, nontender      Assessment: Encounter Diagnoses  Name Primary?  . Moderate persistent asthmatic bronchitis without complication Yes  . Non-seasonal allergic rhinitis due to pollen   . Mild intermittent asthmatic bronchitis with acute exacerbation      Plan:  Gave 1 round of albuterol neb.     Discussed symptom, exam findings, recommendations below  Recommendations  Continue a daily antihistamine such as Loratadine  For the next week try Mucinex DM instead of the sudafed  Rest  Hydrate well with water  Begin Albuterol inhaler, 2 puffs every 4- 6 hours as needed for coughing, shortness of breath or wheezing  Begin Levaquin antibiotic  If not much improved within a week then recheck or call   Sudie was seen today for coughing congestion.  Diagnoses and all orders for this visit:  Moderate persistent asthmatic bronchitis without complication  Non-seasonal allergic rhinitis due to pollen  Mild intermittent asthmatic bronchitis with acute exacerbation -     albuterol (PROVENTIL HFA;VENTOLIN HFA) 108 (90 Base) MCG/ACT inhaler; Inhale 2 puffs into the lungs every 6 (six) hours as needed for wheezing or shortness of breath.  Other orders -     levofloxacin (LEVAQUIN) 500 MG tablet; Take 1 tablet (500 mg total) by mouth daily.

## 2017-03-24 NOTE — Patient Instructions (Signed)
Recommendations  Continue a daily antihistamine such as Loratadine  For the next week try Mucinex DM instead of the sudafed  Rest  Hydrate well with water  Begin Albuterol inhaler, 2 puffs every 4- 6 hours as needed for coughing, shortness of breath or wheezing  Begin Levaquin antibiotic  If not much improved within a week then recheck or call

## 2017-03-29 ENCOUNTER — Telehealth: Payer: Self-pay | Admitting: Family Medicine

## 2017-03-29 ENCOUNTER — Other Ambulatory Visit: Payer: Self-pay | Admitting: Medical

## 2017-03-29 NOTE — Telephone Encounter (Signed)
Recv'd fax request for Levofloxacin refill #7 to [facility]

## 2017-03-30 MED ORDER — LEVOFLOXACIN 500 MG PO TABS: 500.0000 mg | ORAL_TABLET | Freq: Every day | ORAL | 0 refills | Status: DC

## 2017-03-30 NOTE — Telephone Encounter (Signed)
Pt states that she is on the road to recovery and wants a refill so she continues to improve. She is NOT getting worse, but did not want to get worse over the weekend. Advised pt we usually do not refill ABX rountinely and need prescriber approval.

## 2017-03-30 NOTE — Telephone Encounter (Signed)
Ok to refill and if she is not back to baseline after finishing this round she will need to see Vincenza Hews again.

## 2017-03-30 NOTE — Telephone Encounter (Signed)
Pt states she finished the antibitoic this morning and is not coughing and feeling some better maybe 75% better but doesn't want to go into the weekend and get worse again.please advise

## 2017-03-30 NOTE — Telephone Encounter (Signed)
Please find out how much better she is feeling? Did she complete the antibiotic and when?

## 2017-03-30 NOTE — Telephone Encounter (Signed)
Sent in another round of meds. And if not back to baseline she will need to been again

## 2017-03-30 NOTE — Addendum Note (Signed)
Addended by: Herminio Commons A on: 03/30/2017 11:12 AM   Modules accepted: Orders

## 2017-04-14 ENCOUNTER — Ambulatory Visit (INDEPENDENT_AMBULATORY_CARE_PROVIDER_SITE_OTHER): Payer: Medicare Other | Admitting: Medical

## 2017-04-14 ENCOUNTER — Encounter: Payer: Self-pay | Admitting: Medical

## 2017-04-14 VITALS — BP 142/80 | HR 88 | Temp 98.5°F | Wt 163.4 lb

## 2017-04-14 DIAGNOSIS — R059 Cough, unspecified: Secondary | ICD-10-CM

## 2017-04-14 DIAGNOSIS — J4520 Mild intermittent asthma, uncomplicated: Secondary | ICD-10-CM | POA: Diagnosis not present

## 2017-04-14 DIAGNOSIS — Z23 Encounter for immunization: Secondary | ICD-10-CM

## 2017-04-14 DIAGNOSIS — R05 Cough: Secondary | ICD-10-CM | POA: Diagnosis not present

## 2017-04-14 DIAGNOSIS — J309 Allergic rhinitis, unspecified: Secondary | ICD-10-CM

## 2017-04-14 DIAGNOSIS — J449 Chronic obstructive pulmonary disease, unspecified: Secondary | ICD-10-CM | POA: Diagnosis not present

## 2017-04-14 MED ORDER — IPRATROPIUM BROMIDE 0.03 % NA SOLN: 2.0000 | Freq: Two times a day (BID) | NASAL | 3 refills | Status: DC

## 2017-04-14 MED ORDER — ALBUTEROL SULFATE (2.5 MG/3ML) 0.083% IN NEBU
2.5000 mg | INHALATION_SOLUTION | Freq: Once | RESPIRATORY_TRACT | Status: AC
  Administered 2017-04-14: 2.5 mg via RESPIRATORY_TRACT

## 2017-04-14 MED ORDER — ALBUTEROL SULFATE (2.5 MG/3ML) 0.083% IN NEBU: 2.5000 mg | INHALATION_SOLUTION | Freq: Four times a day (QID) | RESPIRATORY_TRACT | 2 refills | Status: DC | PRN

## 2017-04-14 MED ORDER — HYDROCODONE-HOMATROPINE 5-1.5 MG/5ML PO SYRP: 5.0000 mL | ORAL_SOLUTION | Freq: Three times a day (TID) | ORAL | 0 refills | Status: DC | PRN

## 2017-04-14 NOTE — Progress Notes (Signed)
Subjective:  Chief Complaint  Patient presents with  . coughing    coughing x 2 weeks    Here for cough x 2 weeks, lots of cough, worse at night in the bed even if propping head up.  Blowing nose all the time, lots of nasal congestion.   Getting cough syrups.   Would like refill on cough medication I gave her last spring.  The cough syrup also seems to help her congestion in her head.  Taking Xyzal which is helping some. Using 1/2 tablet as it dries her out too much if taking a whole one.   Has drum roll sound in lungs at times.   No fever.  Using the albuterol HFA but doesn't feel it works as good as she did with the neb treatment she had here last visit.    She does feel improved from the 03/24/17 visit before she started with this round in the past 2 weeks.     Past Medical History:  Diagnosis Date  . Allergy   . Asthma, mild intermittent 02/01/2016   Current Outpatient Prescriptions on File Prior to Visit  Medication Sig Dispense Refill  . albuterol (PROVENTIL HFA;VENTOLIN HFA) 108 (90 Base) MCG/ACT inhaler Inhale 2 puffs into the lungs every 6 (six) hours as needed for wheezing or shortness of breath. 1 Inhaler 0  . Biotin 5000 MCG CAPS Take by mouth.    . calcium carbonate (OSCAL) 1500 (600 Ca) MG TABS tablet Take 600 mg of elemental calcium by mouth 2 (two) times daily with a meal.    . cholecalciferol (VITAMIN D) 1000 units tablet Take 2,000 Units by mouth daily.    Marland Kitchen. estradiol (VIVELLE-DOT) 0.1 MG/24HR patch Place 1 patch onto the skin once a week.     . pyridOXINE (VITAMIN B-6) 100 MG tablet Take 100 mg by mouth daily.    . vitamin C (ASCORBIC ACID) 500 MG tablet Take 500 mg by mouth daily.     No current facility-administered medications on file prior to visit.    ROS as in subjective   Objective: BP (!) 142/80   Pulse 88   Temp 98.5 F (36.9 C)   Wt 163 lb 6.4 oz (74.1 kg)   SpO2 97%   BMI 28.49 kg/m   General appearance: Alert, WD/WN, no distress            Skin: warm, no rash, no diaphoresis                           Head: no sinus tenderness                            Eyes: conjunctiva normal, corneas clear, PERRLA                            Ears: pearly TMs, external ear canals normal                          Nose: septum midline, turbinates swollen, with erythema and clear discharge             Mouth/throat: MMM, tongue normal, mild pharyngeal erythema                           Neck: supple, no adenopathy, no thyromegaly, non  tender                          Heart: RRR, normal S1, S2, no murmurs                         Lungs: +bronchial breath sounds, no rhonchi, +faint wheezes, no rales                Extremities: no edema, non tender        Assessment: Encounter Diagnoses  Name Primary?  . Mild intermittent asthma without complication Yes  . Allergic rhinitis, unspecified seasonality, unspecified trigger   . Need for influenza vaccination   . Cough      Plan: Discussed symptoms, exam findings.   Gave 1 round of albuterol with improvement of cough spells.  Recommendations  Continue 1/2 tablet of Xyzal allergy pill daily  Begin trial of Ipratropium nasal spray daily for nasal congestion and drippy nose  Begin using nebulizer treatment at home with albuterol, 1 treatment up to 3 times daily as needed for cough spells, wheezing ,and shortness of breath  You can use the Hycodan cough syrup as needed, but caution as this can make you drowsy  Lets plan a recheck in 2-3 weeks  If you continue to have bad cough or not much improved we may need to add a preventative inhaler  Dispensed nebulizer for home use.   Counseled on the influenza virus vaccine.  Vaccine information sheet given.   High dose Influenza vaccine given after consent obtained.   Zelena was seen today for coughing.  Diagnoses and all orders for this visit:  Mild intermittent asthma without complication -     Spirometry with Graph -     albuterol  (PROVENTIL) (2.5 MG/3ML) 0.083% nebulizer solution 2.5 mg; Take 3 mLs (2.5 mg total) by nebulization once.  Allergic rhinitis, unspecified seasonality, unspecified trigger  Need for influenza vaccination  Cough  Other orders -     ipratropium (ATROVENT) 0.03 % nasal spray; Place 2 sprays into both nostrils every 12 (twelve) hours. -     HYDROcodone-homatropine (HYCODAN) 5-1.5 MG/5ML syrup; Take 5 mLs by mouth every 8 (eight) hours as needed for cough. -     albuterol (PROVENTIL) (2.5 MG/3ML) 0.083% nebulizer solution; Take 3 mLs (2.5 mg total) by nebulization every 6 (six) hours as needed for wheezing or shortness of breath.

## 2017-04-14 NOTE — Patient Instructions (Signed)
Recommendations  Continue 1/2 tablet of Xyzal allergy pill daily  Begin trial of Ipratropium nasal spray daily for nasal congestion and drippy nose  Begin using nebulizer treatment at home with albuterol, 1 treatment up to 3 times daily as needed for cough spells, wheezing ,and shortness of breath  You can use the Hycodan cough syrup as needed, but caution as this can make you drowsy  Lets plan a recheck in 2-3 weeks  If you continue to have bad cough or not much improved we may need to add a preventative inhaler

## 2017-06-16 DIAGNOSIS — Z1231 Encounter for screening mammogram for malignant neoplasm of breast: Secondary | ICD-10-CM | POA: Diagnosis not present

## 2017-06-16 LAB — HM MAMMOGRAPHY

## 2017-06-19 ENCOUNTER — Encounter: Payer: Self-pay | Admitting: Family Medicine

## 2017-06-27 DIAGNOSIS — L282 Other prurigo: Secondary | ICD-10-CM | POA: Diagnosis not present

## 2017-06-27 DIAGNOSIS — L239 Allergic contact dermatitis, unspecified cause: Secondary | ICD-10-CM | POA: Diagnosis not present

## 2017-08-08 DIAGNOSIS — L570 Actinic keratosis: Secondary | ICD-10-CM | POA: Diagnosis not present

## 2017-08-08 DIAGNOSIS — L249 Irritant contact dermatitis, unspecified cause: Secondary | ICD-10-CM | POA: Diagnosis not present

## 2017-08-16 DIAGNOSIS — H47322 Drusen of optic disc, left eye: Secondary | ICD-10-CM | POA: Diagnosis not present

## 2017-08-16 DIAGNOSIS — H349 Unspecified retinal vascular occlusion: Secondary | ICD-10-CM | POA: Diagnosis not present

## 2017-08-16 DIAGNOSIS — Z961 Presence of intraocular lens: Secondary | ICD-10-CM | POA: Diagnosis not present

## 2017-08-28 ENCOUNTER — Encounter: Payer: Self-pay | Admitting: Family Medicine

## 2017-08-28 ENCOUNTER — Ambulatory Visit: Payer: Medicare Other | Admitting: Family Medicine

## 2017-08-28 VITALS — BP 120/74 | HR 68 | Temp 97.9°F | Ht 64.0 in | Wt 162.4 lb

## 2017-08-28 DIAGNOSIS — H6121 Impacted cerumen, right ear: Secondary | ICD-10-CM

## 2017-08-28 DIAGNOSIS — H6123 Impacted cerumen, bilateral: Secondary | ICD-10-CM | POA: Diagnosis not present

## 2017-08-28 DIAGNOSIS — J309 Allergic rhinitis, unspecified: Secondary | ICD-10-CM | POA: Diagnosis not present

## 2017-08-28 NOTE — Progress Notes (Signed)
Chief Complaint  Patient presents with  . Cerumen Impaction    right ear is worse than left but would like to have both ears done.    She has decreased hearing in the right ear x 4 days. Also has some plugging/popping on the right, and noticed some wax when she washed with a washcloth.  She does not use q-tips anymore. She typically has problems with wax build-up, needs removal about once a year.  Denies discomfort.  She has chronic allergies, treated with Flonase and claritin with good results. Admits to using Flonase 2x/d or sometimes even 3x/d when she feels bad/sick.   PMH, PSH, SH reviewed  Outpatient Encounter Medications as of 08/28/2017  Medication Sig  . Biotin 5000 MCG CAPS Take by mouth.  . calcium carbonate (OSCAL) 1500 (600 Ca) MG TABS tablet Take 600 mg of elemental calcium by mouth 2 (two) times daily with a meal.  . cholecalciferol (VITAMIN D) 1000 units tablet Take 2,000 Units by mouth daily.  Marland Kitchen. estradiol (VIVELLE-DOT) 0.1 MG/24HR patch Place 1 patch onto the skin once a week.   Marland Kitchen. ipratropium (ATROVENT) 0.03 % nasal spray Place 2 sprays into both nostrils every 12 (twelve) hours.  . pyridOXINE (VITAMIN B-6) 100 MG tablet Take 100 mg by mouth daily.  . vitamin C (ASCORBIC ACID) 500 MG tablet Take 500 mg by mouth daily.  Marland Kitchen. albuterol (PROVENTIL HFA;VENTOLIN HFA) 108 (90 Base) MCG/ACT inhaler Inhale 2 puffs into the lungs every 6 (six) hours as needed for wheezing or shortness of breath. (Patient not taking: Reported on 08/28/2017)  . albuterol (PROVENTIL) (2.5 MG/3ML) 0.083% nebulizer solution Take 3 mLs (2.5 mg total) by nebulization every 6 (six) hours as needed for wheezing or shortness of breath. (Patient not taking: Reported on 08/28/2017)  . [DISCONTINUED] HYDROcodone-homatropine (HYCODAN) 5-1.5 MG/5ML syrup Take 5 mLs by mouth every 8 (eight) hours as needed for cough.   No facility-administered encounter medications on file as of 08/28/2017.    Allergies  Allergen  Reactions  . Sulfa Antibiotics   . Aspirin   . Tetanus Toxoids    ROS: no fever, chills, URI symptoms, headache, dizziness, cough, shortness of breath or other complaints.  PHYSICAL EXAM:  BP 120/74   Pulse 68   Temp 97.9 F (36.6 C) (Tympanic)   Ht 5\' 4"  (1.626 m)   Wt 162 lb 6.4 oz (73.7 kg)   BMI 27.88 kg/m   Well appearing, pleasant, elderly female in no distress HEENT: conjunctiva and sclera are clear. Cerumen obstruction bilaterally, TM's not visualized. Nasal mucosa is mildly edematous, clear mucus on the right, no erythema. Sinuses are nontender. Op is clear Neck: no lymphadenopathy or mass  After lavage--normal EAC's and TM's. Symptoms resolved. Tolerated procedure well  ASSESSMENT/PLAN:  Bilateral impacted cerumen  Hearing loss due to cerumen impaction, right - resolved after lavage  Allergic rhinitis, unspecified seasonality, unspecified trigger - instructed on proper use of Flonase (2 sprays q nostril qd)

## 2017-08-31 ENCOUNTER — Ambulatory Visit: Payer: Medicare Other | Admitting: Medical

## 2017-12-15 ENCOUNTER — Telehealth: Payer: Self-pay | Admitting: Family Medicine

## 2017-12-15 NOTE — Telephone Encounter (Signed)
Called pt to schedule Medicare Well Visit or [facility] appt. Pt said she does not wan to schedule anything right now because she is very consumed with helping a friend who is being transferred to Hospice or nursing home. Pt will call back to schedule when things settle down some

## 2018-01-17 DIAGNOSIS — Z6827 Body mass index (BMI) 27.0-27.9, adult: Secondary | ICD-10-CM | POA: Diagnosis not present

## 2018-01-17 DIAGNOSIS — Z01419 Encounter for gynecological examination (general) (routine) without abnormal findings: Secondary | ICD-10-CM | POA: Diagnosis not present

## 2018-03-19 ENCOUNTER — Other Ambulatory Visit (INDEPENDENT_AMBULATORY_CARE_PROVIDER_SITE_OTHER): Payer: Medicare Other

## 2018-03-19 DIAGNOSIS — Z23 Encounter for immunization: Secondary | ICD-10-CM | POA: Diagnosis not present

## 2018-04-23 ENCOUNTER — Ambulatory Visit: Payer: Medicare Other | Admitting: Family Medicine

## 2018-04-23 ENCOUNTER — Encounter: Payer: Self-pay | Admitting: Family Medicine

## 2018-04-23 VITALS — BP 118/74 | HR 65 | Temp 97.8°F | Ht 63.5 in | Wt 159.2 lb

## 2018-04-23 DIAGNOSIS — I493 Ventricular premature depolarization: Secondary | ICD-10-CM | POA: Diagnosis not present

## 2018-04-23 DIAGNOSIS — J301 Allergic rhinitis due to pollen: Secondary | ICD-10-CM

## 2018-04-23 DIAGNOSIS — E785 Hyperlipidemia, unspecified: Secondary | ICD-10-CM | POA: Diagnosis not present

## 2018-04-23 DIAGNOSIS — Z79890 Hormone replacement therapy (postmenopausal): Secondary | ICD-10-CM

## 2018-04-23 DIAGNOSIS — J4520 Mild intermittent asthma, uncomplicated: Secondary | ICD-10-CM

## 2018-04-23 DIAGNOSIS — Z23 Encounter for immunization: Secondary | ICD-10-CM

## 2018-04-23 DIAGNOSIS — Z0000 Encounter for general adult medical examination without abnormal findings: Secondary | ICD-10-CM

## 2018-04-23 NOTE — Progress Notes (Signed)
Alexandria [name] is a 72 y.o. female who presents for annual wellness visit and follow-up on chronic medical conditions.  She has particular concerns or complaints.  She does have underlying allergies and uses Flonase and Claritin with good results.  She occasionally does need albuterol for her seasonal asthma symptoms.  She continues on estrogen replacement from her gynecologist.  She did have a Cologuard in 2017.  She also complains of a tingling sensation in the hand mainly in the thumb and index finger that occurs when she uses her hands a lot.  Also of note is a previous history of allergy to tetanus.  Family and social history was reviewed.   Immunizations and Health Maintenance Immunization History  Administered Date(s) Administered  . Influenza, High Dose Seasonal PF 03/19/2018  . Influenza,inj,Quad PF,6+ Mos 03/15/2016  . Pneumococcal Conjugate-13 02/01/2016   Health Maintenance Due  Topic Date Due  . PNA vac Low Risk Adult (2 of 2 - PPSV23) 01/31/2017    Last Pap smear: 12/2017 Dr. Jennette Kettle Last mammogram: 06/16/17 Last colonoscopy: cologuard 2019 Last DEXA: 01-06-2014 Dentist: 07-2017 Ophtho:Q six months Exercise: gym couple times a week, walking  Other doctors caring for patient include:Neil Advanced directives:yes copy asked for    Depression screen:  See questionnaire below.  Depression screen [facility] 2/9 04/23/2018 12/23/2013  Decreased Interest 0 0  Down, Depressed, Hopeless 0 0  PHQ - 2 Score 0 0    Fall Risk Screen: see questionnaire below. Fall Risk  04/23/2018 12/23/2013  Falls in the past year? 0 No    ADL screen:  See questionnaire below Functional Status Survey: Is the patient deaf or have difficulty hearing?: No Does the patient have difficulty seeing, even when wearing glasses/contacts?: No Does the patient have difficulty concentrating, remembering, or making decisions?: No Does the patient have difficulty walking or climbing stairs?: No Does the patient have  difficulty dressing or bathing?: No Does the patient have difficulty doing errands alone such as visiting a doctor's office or shopping?: No   Review of Systems Constitutional: -, -unexpected weight change, -anorexia, -fatigue Allergy: -sneezing, -itching, -congestion Dermatology: denies changing moles, rash, lumps ENT: -runny nose, -ear pain, -sore throat,  Cardiology:  -chest pain, -palpitations, -orthopnea, Respiratory: -cough, -shortness of breath, -dyspnea on exertion, -wheezing,  Gastroenterology: -abdominal pain, -nausea, -vomiting, -diarrhea, -constipation, -dysphagia Hematology: -bleeding or bruising problems Musculoskeletal: -arthralgias, -myalgias, -joint swelling, -back pain, - Ophthalmology: -vision changes,  Urology: -dysuria, -difficulty urinating,  -urinary frequency, -urgency, incontinence Neurology: -, -numbness, , -memory loss, -falls, -dizziness    PHYSICAL EXAM:  BP 118/74 (BP Location: Left Arm, Patient Position: Sitting)   Pulse 65   Temp 97.8 F (36.6 C)   Ht 5' 3.5" (1.613 m)   Wt 159 lb 3.2 oz (72.2 kg)   SpO2 98%   BMI 27.76 kg/m   General Appearance: Alert, cooperative, no distress, appears stated age Head: Normocephalic, without obvious abnormality, atraumatic Eyes: PERRL, conjunctiva/corneas clear, EOM's intact, fundi benign Ears: Normal TM's and external ear canals Nose: Nares normal, mucosa normal, no drainage or sinus tenderness Throat: Lips, mucosa, and tongue normal; teeth and gums normal Neck: Supple, no lymphadenopathy;  thyroid:  no enlargement/tenderness/nodules; no carotid bruit or JVD Lungs: Clear to auscultation bilaterally without wheezes, rales or ronchi; respirations unlabored Heart: Irregular rate and rhythm S1 and S2 normal, no murmur, rubor gallop Abdomen: Soft, non-tender, nondistended, normoactive bowel sounds,  no masses, no hepatosplenomegaly Extremities: No clubbing, cyanosis or edema Pulses: 2+ and symmetric all  extremities Skin:  Skin color, texture, turgor normal, no rashes or lesions Lymph nodes: Cervical, supraclavicular, and axillary nodes normal Neurologic:  CNII-XII intact, normal strength, sensation and gait; reflexes 2+ and symmetric throughout Psych: Normal mood, affect, hygiene and grooming. EKG does show unifocal PVC ASSESSMENT/PLAN: Routine general medical examination at a health care facility - Plan: CBC with Differential/Platelet, Comprehensive metabolic panel, Lipid panel  Non-seasonal allergic rhinitis due to pollen  Mild intermittent asthma without complication  Hormone replacement therapy (HRT) - Plan: CBC with Differential/Platelet, Comprehensive metabolic panel  Borderline hyperlipidemia - Plan: Lipid panel  Need for vaccination against Streptococcus pneumoniae  Unifocal PVCs - Plan: EKG 12-Lead She is really having no cardiac symptoms I explained that I did not think the PVC was of any major concern.  She was comfortable with that.  She will continue on her present medications and when she needs her Flonase renewed she will.    Discussed monthly self breast exams and yearly mammograms; at least 30 minutes of aerobic activity at least 5 days/week and weight-bearing exercise 2x/week; .  Immunization recommendations discussed.  Colonoscopy recommendations reviewed   Medicare Attestation I have personally reviewed: The patient's medical and social history Their use of alcohol, tobacco or illicit drugs Their current medications and supplements The patient's functional ability including ADLs,fall risks, home safety risks, cognitive, and hearing and visual impairment Diet and physical activities Evidence for depression or mood disorders  The patient's weight, height, and BMI have been recorded in the chart.  I have made referrals, counseling, and provided education to the patient based on review of the above and I have provided the patient with a written personalized care  plan for preventive services.     Sharlot Gowda, MD   04/23/2018

## 2018-04-24 LAB — COMPREHENSIVE METABOLIC PANEL
ALBUMIN: 3.9 g/dL (ref 3.5–4.8)
ALT: 14 IU/L (ref 0–32)
AST: 18 IU/L (ref 0–40)
Albumin/Globulin Ratio: 1.6 (ref 1.2–2.2)
Alkaline Phosphatase: 53 IU/L (ref 39–117)
BUN / CREAT RATIO: 26 (ref 12–28)
BUN: 20 mg/dL (ref 8–27)
Bilirubin Total: 0.4 mg/dL (ref 0.0–1.2)
CALCIUM: 9.4 mg/dL (ref 8.7–10.3)
CO2: 24 mmol/L (ref 20–29)
CREATININE: 0.76 mg/dL (ref 0.57–1.00)
Chloride: 101 mmol/L (ref 96–106)
GFR, EST AFRICAN AMERICAN: 91 mL/min/{1.73_m2} (ref >59)
GFR, EST NON AFRICAN AMERICAN: 79 mL/min/{1.73_m2} (ref >59)
GLUCOSE: 83 mg/dL (ref 65–99)
Globulin, Total: 2.5 g/dL (ref 1.5–4.5)
Potassium: 4.8 mmol/L (ref 3.5–5.2)
Sodium: 140 mmol/L (ref 134–144)
TOTAL PROTEIN: 6.4 g/dL (ref 6.0–8.5)

## 2018-04-24 LAB — LIPID PANEL
Chol/HDL Ratio: 3.8 ratio (ref 0.0–4.4)
Cholesterol, Total: 226 mg/dL — ABNORMAL HIGH (ref 100–199)
HDL: 59 mg/dL (ref >39)
LDL CALC: 153 mg/dL — AB (ref 0–99)
Triglycerides: 70 mg/dL (ref 0–149)
VLDL CHOLESTEROL CAL: 14 mg/dL (ref 5–40)

## 2018-04-24 LAB — CBC WITH DIFFERENTIAL/PLATELET
BASOS ABS: 0 10*3/uL (ref 0.0–0.2)
Basos: 0 %
EOS (ABSOLUTE): 0.1 10*3/uL (ref 0.0–0.4)
Eos: 2 %
HEMOGLOBIN: 15.8 g/dL (ref 11.1–15.9)
Hematocrit: 46.6 % (ref 34.0–46.6)
IMMATURE GRANS (ABS): 0 10*3/uL (ref 0.0–0.1)
IMMATURE GRANULOCYTES: 0 %
LYMPHS: 34 %
Lymphocytes Absolute: 1.9 10*3/uL (ref 0.7–3.1)
MCH: 29.7 pg (ref 26.6–33.0)
MCHC: 33.9 g/dL (ref 31.5–35.7)
MCV: 88 fL (ref 79–97)
MONOCYTES: 7 %
Monocytes Absolute: 0.4 10*3/uL (ref 0.1–0.9)
NEUTROS PCT: 57 %
Neutrophils Absolute: 3.2 10*3/uL (ref 1.4–7.0)
PLATELETS: 189 10*3/uL (ref 150–450)
RBC: 5.32 x10E6/uL — AB (ref 3.77–5.28)
RDW: 13 % (ref 12.3–15.4)
WBC: 5.7 10*3/uL (ref 3.4–10.8)

## 2018-04-27 ENCOUNTER — Telehealth: Payer: Self-pay

## 2018-04-27 MED ORDER — DICLOFENAC SODIUM 75 MG PO TBEC: 75.0000 mg | DELAYED_RELEASE_TABLET | Freq: Two times a day (BID) | ORAL | 0 refills | Status: DC

## 2018-04-27 NOTE — Telephone Encounter (Signed)
I called in Voltaren

## 2018-04-27 NOTE — Telephone Encounter (Signed)
Patient called stating she is in back pain and the ibuprofen is not helping. She wants to know if something else can be called in to the pharmacy. Please advise.

## 2018-04-30 NOTE — Telephone Encounter (Signed)
Pt was advised KH 

## 2018-06-05 ENCOUNTER — Telehealth: Payer: Self-pay | Admitting: Family Medicine

## 2018-06-05 MED ORDER — HYDROCODONE-HOMATROPINE 5-1.5 MG/5ML PO SYRP: 5.0000 mL | ORAL_SOLUTION | Freq: Three times a day (TID) | ORAL | 0 refills | Status: DC | PRN

## 2018-06-05 NOTE — Telephone Encounter (Signed)
Pt would like a renewal Hydromet. Pt has bad coughing spells at night. Please advise. KH

## 2018-06-05 NOTE — Telephone Encounter (Signed)
   Patient would like call back from nurse  Started 1 week ago with cold,  cough mainly at night, eyes watery She takes allergy meds daily No fever , she would just like to know if she needs to do anything else  Did offer pt appt for tomorrow, no openings today

## 2018-06-18 ENCOUNTER — Ambulatory Visit: Payer: Medicare Other | Admitting: Family Medicine

## 2018-06-18 ENCOUNTER — Encounter: Payer: Self-pay | Admitting: Family Medicine

## 2018-06-18 VITALS — BP 120/70 | HR 68 | Temp 98.0°F | Ht 64.0 in | Wt 166.4 lb

## 2018-06-18 DIAGNOSIS — J029 Acute pharyngitis, unspecified: Secondary | ICD-10-CM

## 2018-06-18 DIAGNOSIS — J069 Acute upper respiratory infection, unspecified: Secondary | ICD-10-CM

## 2018-06-18 DIAGNOSIS — H6123 Impacted cerumen, bilateral: Secondary | ICD-10-CM | POA: Diagnosis not present

## 2018-06-18 DIAGNOSIS — Z803 Family history of malignant neoplasm of breast: Secondary | ICD-10-CM | POA: Diagnosis not present

## 2018-06-18 DIAGNOSIS — Z1231 Encounter for screening mammogram for malignant neoplasm of breast: Secondary | ICD-10-CM | POA: Diagnosis not present

## 2018-06-18 LAB — POCT RAPID STREP A (OFFICE): Rapid Strep A Screen: NEGATIVE

## 2018-06-18 NOTE — Patient Instructions (Signed)
  Drink plenty of water. You may continue to use sudafed as needed for runny nose/drainage. Continue the flonase and claritin.  Consider taking guaifenesin (in Mucinex or robitussin) as an expectorant to help loosen your mucus/phlegm. You may continue the cough syrup (hydrocodone) from Dr. Susann GivensLalonde for severe coughing at night.  Return if you develop any fever, worsening cough, shortness of breath, change in your mucus or phlegm to becoming discolored (yellow-green), sinus pain, etc.  I'm glad that you ears feel better after removing the large amount of wax.

## 2018-06-18 NOTE — Progress Notes (Signed)
Chief Complaint  Patient presents with  . Cough    ST and drainage. Her head feels like it is going to explode. Lots of pressure. Started taking sudafed, helped some but has some ringing in her head. Wants to make sure she doesn't have strep or bronchitis.     She got sick about 2 weeks ago with cold/cough symptoms. She called on 12/17 and got prescription for cough syrup, which helped her sleep.  She went out of town for Christmas, had +sick contact. She had gotten better, but while out of town, she got sick again.  Her ears felt very plugged. Ears got very full, felt like they couldn't pop, especially after she washed her hair.    She started taking sudafed to help with her nasal drainage, which helped some, but only barely helped with her ears.  Mucus has all been white, not discolored.  She has sore throat from PND, and she noticed that her throat was very red when she looked at it in the mirror.  Denies any shortness of breath, not needing inhaler Cough is mainly at night.  Uses Flonase and nasal saline Hycodan syrup at bedtime, didn't need it last night.  Ears feel significantly better (unplugged, and hearing is better) after wax was removed during visit today.  PMH, PSH, SH reviewed.  Outpatient Encounter Medications as of 06/18/2018  Medication Sig Note  . Biotin 5000 MCG CAPS Take by mouth.   . calcium carbonate (OSCAL) 1500 (600 Ca) MG TABS tablet Take 600 mg of elemental calcium by mouth 2 (two) times daily with a meal.   . cholecalciferol (VITAMIN D) 1000 units tablet Take 2,000 Units by mouth daily.   Marland Kitchen. estradiol (VIVELLE-DOT) 0.1 MG/24HR patch Place 1 patch onto the skin once a week.    . fluticasone (FLONASE) 50 MCG/ACT nasal spray Place 2 sprays into both nostrils daily. 08/28/2017: Reports sometimes using it 2-3x/day when sick  . loratadine (CLARITIN) 10 MG tablet Take 10 mg by mouth daily.   Marland Kitchen. pyridOXINE (VITAMIN B-6) 100 MG tablet Take 100 mg by mouth daily.   .  vitamin C (ASCORBIC ACID) 500 MG tablet Take 500 mg by mouth daily.   . [DISCONTINUED] estradiol (CLIMARA - DOSED IN MG/24 HR) 0.1 mg/24hr patch APPLY 1 PATCH TOPICALLY ONCE A WEEK   . albuterol (PROVENTIL HFA;VENTOLIN HFA) 108 (90 Base) MCG/ACT inhaler Inhale 2 puffs into the lungs every 6 (six) hours as needed for wheezing or shortness of breath. (Patient not taking: Reported on 08/28/2017)   . albuterol (PROVENTIL) (2.5 MG/3ML) 0.083% nebulizer solution Take 3 mLs (2.5 mg total) by nebulization every 6 (six) hours as needed for wheezing or shortness of breath. (Patient not taking: Reported on 08/28/2017)   . HYDROcodone-homatropine (HYCODAN) 5-1.5 MG/5ML syrup Take 5 mLs by mouth every 8 (eight) hours as needed for cough. (Patient not taking: Reported on 06/18/2018)   . ipratropium (ATROVENT) 0.03 % nasal spray Place 2 sprays into both nostrils every 12 (twelve) hours. (Patient not taking: Reported on 04/23/2018)   . [DISCONTINUED] diclofenac (VOLTAREN) 75 MG EC tablet Take 1 tablet (75 mg total) by mouth 2 (two) times daily.    No facility-administered encounter medications on file as of 06/18/2018.    Allergies  Allergen Reactions  . Sulfa Antibiotics   . Aspirin   . Tetanus Toxoids    ROS: URI symptoms and ear complaints as per HPI.  No fever, chills, nausea, vomiting, diarrhea.  Some slightly loose stool,  but has had dietary changes. Denies any shortness of breath, chest pain, palpitations. No bleeding, bruising, rash or other concerns.  PHYSICAL EXAM:  BP 120/70   Pulse 68   Temp 98 F (36.7 C) (Tympanic)   Ht 5\' 4"  (1.626 m)   Wt 166 lb 6.4 oz (75.5 kg)   BMI 28.56 kg/m   Well-appearing, pleasant elderly female in no distress HEENT: conjunctiva and sclera are clear, EOMI. Nasal mucosa is mild-mod edematous, no erythema. Mucus is clear-white. Sinuses are nontender TM's--initially not visible due to cerumen impaction. After lavage, canals and TM's are normal. OP shows some mild  erythema posteriorly and on the left anterior tonsillar pillar. Neck: no lymphadenopathy or mass Heart: regular rate and rhythm Lungs clear bilaterally Skin: normal turgor, no rash Neuro: alert and oriented, cranial nerves intact, normal gait Psych :normal mood, affect, hygiene and grooming  Rapid strep negative    ASSESSMENT/PLAN:  Viral upper respiratory tract infection - supportive measures reviewed, along with s/sx bacterial infection  Bilateral impacted cerumen - hearing and plugging/discomfort in ears completely resolved after ear lavage  Sore throat - Plan: Rapid Strep A    Drink plenty of water. You may continue to use sudafed as needed for runny nose/drainage. Continue the flonase and claritin.  Consider taking guaifenesin (in Mucinex or robitussin) as an expectorant to help loosen your mucus/phlegm. You may continue the cough syrup (hydrocodone) from Dr. Susann GivensLalonde for severe coughing at night.  Return if you develop any fever, worsening cough, shortness of breath, change in your mucus or phlegm to becoming discolored (yellow-green), sinus pain, etc.  I'm glad that you ears feel better after removing the large amount of wax.

## 2018-09-04 DIAGNOSIS — Z961 Presence of intraocular lens: Secondary | ICD-10-CM | POA: Diagnosis not present

## 2018-09-04 DIAGNOSIS — H47322 Drusen of optic disc, left eye: Secondary | ICD-10-CM | POA: Diagnosis not present

## 2018-09-06 ENCOUNTER — Ambulatory Visit (INDEPENDENT_AMBULATORY_CARE_PROVIDER_SITE_OTHER): Payer: Medicare Other | Admitting: Family Medicine

## 2018-09-06 ENCOUNTER — Other Ambulatory Visit: Payer: Self-pay

## 2018-09-06 VITALS — BP 110/70 | HR 51 | Temp 97.5°F | Wt 164.6 lb

## 2018-09-06 DIAGNOSIS — J069 Acute upper respiratory infection, unspecified: Secondary | ICD-10-CM

## 2018-09-06 MED ORDER — HYDROCODONE-HOMATROPINE 5-1.5 MG/5ML PO SYRP: 5.0000 mL | ORAL_SOLUTION | Freq: Three times a day (TID) | ORAL | 0 refills | Status: DC | PRN

## 2018-09-06 NOTE — Progress Notes (Signed)
   Subjective:    Patient ID: Alexandria [name], female    DOB: 31-Dec-1945, 73 y.o.   MRN: [id number]  HPI Last Friday she developed a sore throat followed telemetry by nasal congestion, dry cough and rhinorrhea.  On Sunday of last week she did note some purulent nasal drainage.  No fever, chills, earache.  She says today she is better except for the coughing.   Review of Systems     Objective:   Physical Exam Alert and in no distress. Tympanic membranes and canals are normal. Pharyngeal area is normal. Neck is supple without adenopathy or thyromegaly. Cardiac exam shows a regular sinus rhythm without murmurs or gallops. Lungs are clear to auscultation.        Assessment & Plan:  Upper respiratory tract infection, unspecified type - Plan: HYDROcodone-homatropine (HYCODAN) 5-1.5 MG/5ML syrup I reassured her that she is getting over from a regular viral upper respiratory infection and we will get more aggressive with treating the coughing.  She was comfortable with that.

## 2018-09-18 DIAGNOSIS — L821 Other seborrheic keratosis: Secondary | ICD-10-CM | POA: Diagnosis not present

## 2018-09-18 DIAGNOSIS — L245 Irritant contact dermatitis due to other chemical products: Secondary | ICD-10-CM | POA: Diagnosis not present

## 2018-09-18 DIAGNOSIS — L814 Other melanin hyperpigmentation: Secondary | ICD-10-CM | POA: Diagnosis not present

## 2018-09-18 DIAGNOSIS — D1801 Hemangioma of skin and subcutaneous tissue: Secondary | ICD-10-CM | POA: Diagnosis not present

## 2018-12-11 DIAGNOSIS — Z0120 Encounter for dental examination and cleaning without abnormal findings: Secondary | ICD-10-CM | POA: Diagnosis not present

## 2019-01-24 DIAGNOSIS — N958 Other specified menopausal and perimenopausal disorders: Secondary | ICD-10-CM | POA: Diagnosis not present

## 2019-01-24 DIAGNOSIS — Z6828 Body mass index (BMI) 28.0-28.9, adult: Secondary | ICD-10-CM | POA: Diagnosis not present

## 2019-01-24 DIAGNOSIS — Z01419 Encounter for gynecological examination (general) (routine) without abnormal findings: Secondary | ICD-10-CM | POA: Diagnosis not present

## 2019-03-20 ENCOUNTER — Telehealth: Payer: Self-pay | Admitting: Family Medicine

## 2019-03-20 DIAGNOSIS — Z1211 Encounter for screening for malignant neoplasm of colon: Secondary | ICD-10-CM

## 2019-03-20 NOTE — Telephone Encounter (Signed)
Pt got notice it was time again for her cologuard, she had one 3 years ago and would like order for this.  Has CPE 04/25/19. Already had flu shot.

## 2019-04-01 DIAGNOSIS — Z1211 Encounter for screening for malignant neoplasm of colon: Secondary | ICD-10-CM | POA: Diagnosis not present

## 2019-04-05 LAB — COLOGUARD: Cologuard: NEGATIVE

## 2019-04-16 NOTE — Progress Notes (Signed)
cologuard results given Negative. [place]

## 2019-04-25 ENCOUNTER — Ambulatory Visit (INDEPENDENT_AMBULATORY_CARE_PROVIDER_SITE_OTHER): Payer: Medicare Other | Admitting: Family Medicine

## 2019-04-25 ENCOUNTER — Other Ambulatory Visit: Payer: Self-pay

## 2019-04-25 ENCOUNTER — Encounter: Payer: Self-pay | Admitting: Family Medicine

## 2019-04-25 VITALS — BP 130/88 | HR 74 | Temp 96.9°F | Ht 63.5 in | Wt 163.8 lb

## 2019-04-25 DIAGNOSIS — Z0000 Encounter for general adult medical examination without abnormal findings: Secondary | ICD-10-CM | POA: Diagnosis not present

## 2019-04-25 DIAGNOSIS — E785 Hyperlipidemia, unspecified: Secondary | ICD-10-CM | POA: Diagnosis not present

## 2019-04-25 DIAGNOSIS — Z79890 Hormone replacement therapy (postmenopausal): Secondary | ICD-10-CM

## 2019-04-25 DIAGNOSIS — J4520 Mild intermittent asthma, uncomplicated: Secondary | ICD-10-CM | POA: Diagnosis not present

## 2019-04-25 DIAGNOSIS — J301 Allergic rhinitis due to pollen: Secondary | ICD-10-CM

## 2019-04-25 LAB — POCT URINALYSIS DIP (PROADVANTAGE DEVICE)
Bilirubin, UA: NEGATIVE
Glucose, UA: NEGATIVE mg/dL
Ketones, POC UA: NEGATIVE mg/dL
Leukocytes, UA: NEGATIVE
Nitrite, UA: NEGATIVE
Protein Ur, POC: NEGATIVE mg/dL
Specific Gravity, Urine: 1.03
Urobilinogen, Ur: 0.2
pH, UA: 5.5 (ref 5.0–8.0)

## 2019-04-25 NOTE — Progress Notes (Signed)
Alexandria [name] is a 73 y.o. female who presents for annual wellness visit,CPE and follow-up on chronic medical conditions.  She does complain of some fullness in her ears and thinks she might have some cerumen.  She does have underlying allergies usually in the spring and fall and does use Zyrtec as well as Flonase.  Occasionally she will need albuterol for her underlying asthma.  She is in the process of getting both of her Shingrix shots.  She follows up with her gynecologist and is getting estrogen there as well as getting a mammogram and DEXA scans done.  Otherwise she has no particular concerns or complaints.  Social and family history was reviewed. Immunizations and Health Maintenance Immunization History  Administered Date(s) Administered  . Influenza, High Dose Seasonal PF 04/06/2014, 03/30/2015, 03/19/2018  . Influenza,inj,Quad PF,6+ Mos 03/15/2016  . Influenza-Unspecified 03/15/2019  . Pneumococcal Conjugate-13 02/01/2016  . Pneumococcal Polysaccharide-23 04/23/2018  . Zoster Recombinat (Shingrix) 03/02/2019   Health Maintenance Due  Topic Date Due  . TETANUS/TDAP  09/16/1964  . MAMMOGRAM  06/16/2018  . DEXA SCAN  01/07/2019    Last Pap smear: aged out  Last mammogram: 12/19 Last colonoscopy:cologuard  Last DEXA: 01/06/14 Dentist: June 2020 Ophtho: 12/19 Exercise: walking, floor exercises  Other doctors caring for patient include: Nori Riis  Advanced directives: no; info given    Depression screen:  See questionnaire below.  Depression screen [facility] 2/9 04/23/2018 12/23/2013  Decreased Interest 0 0  Down, Depressed, Hopeless 0 0  PHQ - 2 Score 0 0    Fall Risk Screen: see questionnaire below. Fall Risk  04/23/2018 12/23/2013  Falls in the past year? 0 No    ADL screen:  See questionnaire below Functional Status Survey:     Review of Systems Constitutional: -, -unexpected weight change, -anorexia, -fatigue Allergy: -sneezing, -itching, -congestion Dermatology: denies  changing moles, rash, lumps ENT: -runny nose, -ear pain, -sore throat,  Cardiology:  -chest pain, -palpitations, -orthopnea, Respiratory: -cough, -shortness of breath, -dyspnea on exertion, -wheezing,  Gastroenterology: -abdominal pain, -nausea, -vomiting, -diarrhea, -constipation, -dysphagia Hematology: -bleeding or bruising problems Musculoskeletal: -arthralgias, -myalgias, -joint swelling, -back pain, - Ophthalmology: -vision changes,  Urology: -dysuria, -difficulty urinating,  -urinary frequency, -urgency, incontinence Neurology: -, -numbness, , -memory loss, -falls, -dizziness    PHYSICAL EXAM:   General Appearance: Alert, cooperative, no distress, appears stated age Head: Normocephalic, without obvious abnormality, atraumatic Eyes: PERRL, conjunctiva/corneas clear, EOM's intact, fundi benign Ears: Normal TM's and external ear canals Nose: Nares normal, mucosa normal, no drainage or sinus tenderness Throat: Lips, mucosa, and tongue normal; teeth and gums normal Neck: Supple, no lymphadenopathy;  thyroid:  no enlargement/tenderness/nodules; no carotid bruit or JVD Lungs: Clear to auscultation bilaterally without wheezes, rales or ronchi; respirations unlabored Heart: Regular rate and rhythm, S1 and S2 normal, no murmur, rubor gallop Abdomen: Soft, non-tender, nondistended, normoactive bowel sounds,  no masses, no hepatosplenomegaly Extremities: No clubbing, cyanosis or edema Pulses: 2+ and symmetric all extremities Skin:  Skin color, texture, turgor normal, no rashes or lesions Lymph nodes: Cervical, supraclavicular, and axillary nodes normal Neurologic:  CNII-XII intact, normal strength, sensation and gait; reflexes 2+ and symmetric throughout Psych: Normal mood, affect, hygiene and grooming.  ASSESSMENT/PLAN: Non-seasonal allergic rhinitis due to pollen  Mild intermittent asthma without complication  Borderline hyperlipidemia  Hormone replacement therapy  (HRT)  Routine general medical examination at a health care facility - Plan: CBC with Differential, Comprehensive metabolic panel, Lipid panel, POCT Urinalysis DIP (Proadvantage Device)  Immunization recommendations  discussed.  Colonoscopy recommendations reviewed   Medicare Attestation I have personally reviewed: The patient's medical and social history Their use of alcohol, tobacco or illicit drugs Their current medications and supplements The patient's functional ability including ADLs,fall risks, home safety risks, cognitive, and hearing and visual impairment Diet and physical activities Evidence for depression or mood disorders  The patient's weight, height, and BMI have been recorded in the chart.  I have made referrals, counseling, and provided education to the patient based on review of the above and I have provided the patient with a written personalized care plan for preventive services.     Sharlot Gowda, MD   04/25/2019

## 2019-04-26 LAB — CBC WITH DIFFERENTIAL/PLATELET
Basophils Absolute: 0 10*3/uL (ref 0.0–0.2)
Basos: 1 %
EOS (ABSOLUTE): 0.1 10*3/uL (ref 0.0–0.4)
Eos: 2 %
Hematocrit: 47.4 % — ABNORMAL HIGH (ref 34.0–46.6)
Hemoglobin: 16.4 g/dL — ABNORMAL HIGH (ref 11.1–15.9)
Immature Grans (Abs): 0 10*3/uL (ref 0.0–0.1)
Immature Granulocytes: 0 %
Lymphocytes Absolute: 1.9 10*3/uL (ref 0.7–3.1)
Lymphs: 31 %
MCH: 30.7 pg (ref 26.6–33.0)
MCHC: 34.6 g/dL (ref 31.5–35.7)
MCV: 89 fL (ref 79–97)
Monocytes Absolute: 0.4 10*3/uL (ref 0.1–0.9)
Monocytes: 6 %
Neutrophils Absolute: 3.6 10*3/uL (ref 1.4–7.0)
Neutrophils: 60 %
Platelets: 243 10*3/uL (ref 150–450)
RBC: 5.35 x10E6/uL — ABNORMAL HIGH (ref 3.77–5.28)
RDW: 12.7 % (ref 11.7–15.4)
WBC: 6 10*3/uL (ref 3.4–10.8)

## 2019-04-26 LAB — COMPREHENSIVE METABOLIC PANEL
ALT: 18 IU/L (ref 0–32)
AST: 22 IU/L (ref 0–40)
Albumin/Globulin Ratio: 1.8 (ref 1.2–2.2)
Albumin: 4.4 g/dL (ref 3.7–4.7)
Alkaline Phosphatase: 66 IU/L (ref 39–117)
BUN/Creatinine Ratio: 21 (ref 12–28)
BUN: 15 mg/dL (ref 8–27)
Bilirubin Total: 0.5 mg/dL (ref 0.0–1.2)
CO2: 27 mmol/L (ref 20–29)
Calcium: 9.9 mg/dL (ref 8.7–10.3)
Chloride: 102 mmol/L (ref 96–106)
Creatinine, Ser: 0.7 mg/dL (ref 0.57–1.00)
GFR calc Af Amer: 99 mL/min/{1.73_m2} (ref >59)
GFR calc non Af Amer: 86 mL/min/{1.73_m2} (ref >59)
Globulin, Total: 2.5 g/dL (ref 1.5–4.5)
Glucose: 82 mg/dL (ref 65–99)
Potassium: 5.1 mmol/L (ref 3.5–5.2)
Sodium: 143 mmol/L (ref 134–144)
Total Protein: 6.9 g/dL (ref 6.0–8.5)

## 2019-04-26 LAB — LIPID PANEL
Chol/HDL Ratio: 4 ratio (ref 0.0–4.4)
Cholesterol, Total: 215 mg/dL — ABNORMAL HIGH (ref 100–199)
HDL: 54 mg/dL (ref >39)
LDL Chol Calc (NIH): 146 mg/dL — ABNORMAL HIGH (ref 0–99)
Triglycerides: 85 mg/dL (ref 0–149)
VLDL Cholesterol Cal: 15 mg/dL (ref 5–40)

## 2019-05-29 DIAGNOSIS — Z0120 Encounter for dental examination and cleaning without abnormal findings: Secondary | ICD-10-CM | POA: Diagnosis not present

## 2019-06-20 DIAGNOSIS — Z1231 Encounter for screening mammogram for malignant neoplasm of breast: Secondary | ICD-10-CM | POA: Diagnosis not present

## 2019-06-20 LAB — HM MAMMOGRAPHY

## 2019-07-09 ENCOUNTER — Ambulatory Visit: Payer: Medicare Other | Attending: Internal Medicine

## 2019-07-09 DIAGNOSIS — Z23 Encounter for immunization: Secondary | ICD-10-CM | POA: Insufficient documentation

## 2019-07-09 NOTE — Progress Notes (Signed)
   Covid-19 Vaccination Clinic  Name:  Alexandria [name]    MRN: [id number] DOB: 1946-02-09  07/09/2019  [name] was observed post Covid-19 immunization for 30 minutes based on pre-vaccination screening without incidence. She was provided with Vaccine Information Sheet and instruction to access the V-Safe system.   [name] was instructed to call 911 with any severe reactions post vaccine: Marland Kitchen Difficulty breathing  . Swelling of your face and throat  . A fast heartbeat  . A bad rash all over your body  . Dizziness and weakness    Immunizations Administered    Name Date Dose VIS Date Route   Pfizer COVID-19 Vaccine 07/09/2019  1:32 PM 0.3 mL 05/31/2019 Intramuscular   Manufacturer: [employer], [employer]   Lot: [id number]   NDC: [address]-0

## 2019-07-29 ENCOUNTER — Ambulatory Visit: Payer: Medicare Other | Attending: Internal Medicine

## 2019-07-29 DIAGNOSIS — Z23 Encounter for immunization: Secondary | ICD-10-CM | POA: Insufficient documentation

## 2019-07-29 NOTE — Progress Notes (Signed)
   Covid-19 Vaccination Clinic  Name:  BREALYNN CONTINO    MRN: [id number] DOB: April 21, 1946  07/29/2019  Ms. Mele was observed post Covid-19 immunization for 15 minutes without incidence. She was provided with Vaccine Information Sheet and instruction to access the V-Safe system.   Ms. Wrobleski was instructed to call 911 with any severe reactions post vaccine: Marland Kitchen Difficulty breathing  . Swelling of your face and throat  . A fast heartbeat  . A bad rash all over your body  . Dizziness and weakness    Immunizations Administered    Name Date Dose VIS Date Route   Pfizer COVID-19 Vaccine 07/29/2019  1:50 PM 0.3 mL 05/31/2019 Intramuscular   Manufacturer: [employer], [employer]   Lot: [id number]   NDC: [address]-5

## 2019-09-06 ENCOUNTER — Telehealth: Payer: Self-pay

## 2019-09-06 NOTE — Telephone Encounter (Signed)
Error. KH 

## 2019-09-06 NOTE — Progress Notes (Signed)
Pt was advised KH 

## 2019-09-10 DIAGNOSIS — H524 Presbyopia: Secondary | ICD-10-CM | POA: Diagnosis not present

## 2019-09-24 ENCOUNTER — Encounter: Payer: Self-pay | Admitting: Family Medicine

## 2019-09-24 ENCOUNTER — Ambulatory Visit (INDEPENDENT_AMBULATORY_CARE_PROVIDER_SITE_OTHER): Payer: Medicare Other | Admitting: Family Medicine

## 2019-09-24 ENCOUNTER — Other Ambulatory Visit: Payer: Self-pay

## 2019-09-24 VITALS — BP 130/80 | HR 77 | Temp 98.4°F | Wt 165.2 lb

## 2019-09-24 DIAGNOSIS — H6123 Impacted cerumen, bilateral: Secondary | ICD-10-CM

## 2019-09-24 NOTE — Progress Notes (Signed)
   Subjective:    Patient ID: Alexandria [name], female    DOB: [birth year], 74 y.o.   MRN: [id number]  HPI She complains of wax buildup in her ears and would like the wax cleaned out.  No earache, drainage, sore throat, fever or chills.   Review of Systems     Objective:   Physical Exam Wax present in both ears and was lavaged out without difficulty.  The TMs and canals were normal.       Assessment & Plan:  Bilateral impacted cerumen No further intervention needed after the ears were lavaged of the cerumen.

## 2019-11-01 DIAGNOSIS — D225 Melanocytic nevi of trunk: Secondary | ICD-10-CM | POA: Diagnosis not present

## 2019-11-01 DIAGNOSIS — L570 Actinic keratosis: Secondary | ICD-10-CM | POA: Diagnosis not present

## 2019-11-01 DIAGNOSIS — L245 Irritant contact dermatitis due to other chemical products: Secondary | ICD-10-CM | POA: Diagnosis not present

## 2019-11-01 DIAGNOSIS — L821 Other seborrheic keratosis: Secondary | ICD-10-CM | POA: Diagnosis not present

## 2019-11-12 DIAGNOSIS — Z0120 Encounter for dental examination and cleaning without abnormal findings: Secondary | ICD-10-CM | POA: Diagnosis not present

## 2019-11-29 DIAGNOSIS — R69 Illness, unspecified: Secondary | ICD-10-CM | POA: Diagnosis not present

## 2019-12-12 DIAGNOSIS — Z0120 Encounter for dental examination and cleaning without abnormal findings: Secondary | ICD-10-CM | POA: Diagnosis not present

## 2020-01-28 DIAGNOSIS — Z1272 Encounter for screening for malignant neoplasm of vagina: Secondary | ICD-10-CM | POA: Diagnosis not present

## 2020-01-28 DIAGNOSIS — Z01419 Encounter for gynecological examination (general) (routine) without abnormal findings: Secondary | ICD-10-CM | POA: Diagnosis not present

## 2020-01-28 DIAGNOSIS — Z90710 Acquired absence of both cervix and uterus: Secondary | ICD-10-CM | POA: Diagnosis not present

## 2020-01-28 DIAGNOSIS — Z6828 Body mass index (BMI) 28.0-28.9, adult: Secondary | ICD-10-CM | POA: Diagnosis not present

## 2020-04-01 ENCOUNTER — Encounter: Payer: Self-pay | Admitting: Family Medicine

## 2020-04-29 ENCOUNTER — Ambulatory Visit (INDEPENDENT_AMBULATORY_CARE_PROVIDER_SITE_OTHER): Payer: Medicare Other | Admitting: Family Medicine

## 2020-04-29 ENCOUNTER — Other Ambulatory Visit: Payer: Self-pay

## 2020-04-29 ENCOUNTER — Encounter: Payer: Self-pay | Admitting: Family Medicine

## 2020-04-29 VITALS — BP 130/86 | HR 67 | Temp 97.3°F | Ht 64.0 in | Wt 159.6 lb

## 2020-04-29 DIAGNOSIS — J301 Allergic rhinitis due to pollen: Secondary | ICD-10-CM | POA: Diagnosis not present

## 2020-04-29 DIAGNOSIS — E785 Hyperlipidemia, unspecified: Secondary | ICD-10-CM

## 2020-04-29 DIAGNOSIS — Z23 Encounter for immunization: Secondary | ICD-10-CM | POA: Diagnosis not present

## 2020-04-29 DIAGNOSIS — J4520 Mild intermittent asthma, uncomplicated: Secondary | ICD-10-CM | POA: Diagnosis not present

## 2020-04-29 DIAGNOSIS — Z79890 Hormone replacement therapy (postmenopausal): Secondary | ICD-10-CM

## 2020-04-29 DIAGNOSIS — Z0000 Encounter for general adult medical examination without abnormal findings: Secondary | ICD-10-CM | POA: Diagnosis not present

## 2020-04-29 NOTE — Progress Notes (Signed)
Alexandria [name] is a 74 y.o. female who presents for annual wellness visit,CPE and follow-up on chronic medical conditions.  She does have underlying allergies and occasionally has difficulty with asthma.  She has not had to use albuterol in quite some time.  She does use an antihistamine and nasal steroid spray on an as-needed basis.  She is on HRT from her gynecologist.  Her weight is relatively stable.  She does exercise fairly regularly.  Does plan to eventually want to get back to the gym.  Psychologically she is doing fine.  She did have some depressive symptoms but they are related to a close friend who had a relative that died.  Medical record social and family history was reviewed.  Immunizations and Health Maintenance Immunization History  Administered Date(s) Administered  . Influenza, High Dose Seasonal PF 04/06/2014, 03/30/2015, 03/19/2018  . Influenza,inj,Quad PF,6+ Mos 03/15/2016  . Influenza-Unspecified 03/15/2019  . PFIZER SARS-COV-2 Vaccination 07/09/2019, 07/29/2019  . Pneumococcal Conjugate-13 02/01/2016  . Pneumococcal Polysaccharide-23 04/23/2018  . Zoster Recombinat (Shingrix) 03/02/2019   Health Maintenance Due  Topic Date Due  . TETANUS/TDAP  Never done  . DEXA SCAN  01/07/2019  . INFLUENZA VACCINE  01/19/2020    Last Pap smear: aged out  Last mammogram: 06/20/19 Last colonoscopy: 08/30/2005 Last DEXA: 01/06/14 Dentist: Q Six months Ophtho: Q year Exercise: Walking greater than a hour   Other doctors caring for patient include: Dr. Jennette Kettle [facility]  Advanced directives: Does Patient Have a Medical Advance Directive?: Yes Type of Advance Directive: Healthcare Power of Attorney, Living will Does patient want to make changes to medical advance directive?: Yes (Inpatient - patient defers changing a medical advance directive at this time - Information given) Copy of Healthcare Power of Attorney in Chart?: Yes - validated most recent copy scanned in chart (See row  information)  Depression screen:  See questionnaire below.  Depression screen [facility] 2/9 04/29/2020 04/25/2019 04/23/2018 12/23/2013  Decreased Interest 0 0 0 0  Down, Depressed, Hopeless 1 0 0 0  PHQ - 2 Score 1 0 0 0    Fall Risk Screen: see questionnaire below. Fall Risk  04/29/2020 04/25/2019 04/23/2018 12/23/2013  Falls in the past year? 0 0 0 No  Risk for fall due to : No Fall Risks - - -    ADL screen:  See questionnaire below Functional Status Survey: Is the patient deaf or have difficulty hearing?: No Does the patient have difficulty seeing, even when wearing glasses/contacts?: No Does the patient have difficulty concentrating, remembering, or making decisions?: No Does the patient have difficulty walking or climbing stairs?: No Does the patient have difficulty dressing or bathing?: No Does the patient have difficulty doing errands alone such as visiting a doctor's office or shopping?: No   Review of Systems Constitutional: -, -unexpected weight change, -anorexia, -fatigue Allergy: -sneezing, -itching, -congestion Dermatology: denies changing moles, rash, lumps ENT: -runny nose, -ear pain, -sore throat,  Cardiology:  -chest pain, -palpitations, -orthopnea, Respiratory: -cough, -shortness of breath, -dyspnea on exertion, -wheezing,  Gastroenterology: -abdominal pain, -nausea, -vomiting, -diarrhea, -constipation, -dysphagia Hematology: -bleeding or bruising problems Musculoskeletal: -arthralgias, -myalgias, -joint swelling, -back pain, - Ophthalmology: -vision changes,  Urology: -dysuria, -difficulty urinating,  -urinary frequency, -urgency, incontinence Neurology: -, -numbness, , -memory loss, -falls, -dizziness    PHYSICAL EXAM:  General Appearance: Alert, cooperative, no distress, appears stated age Head: Normocephalic, without obvious abnormality, atraumatic Eyes: PERRL, conjunctiva/corneas clear, EOM's intact. Ears: Normal TM's and external ear canals Nose: Nares  normal, mucosa normal, no drainage or sinus tenderness Throat: Lips, mucosa, and tongue normal; teeth and gums normal Neck: Supple, no lymphadenopathy;  thyroid:  no enlargement/tenderness/nodules; no carotid bruit or JVD Lungs: Clear to auscultation bilaterally without wheezes, rales or ronchi; respirations unlabored Heart: Regular rate and rhythm, S1 and S2 normal, no murmur, rubor gallop Abdomen: Soft, non-tender, nondistended, normoactive bowel sounds,  no masses, no hepatosplenomegaly Skin:  Skin color, texture, turgor normal, no rashes or lesions Lymph nodes: Cervical, supraclavicular, and axillary nodes normal Neurologic:  CNII-XII intact, normal strength, sensation and gait; reflexes 2+ and symmetric throughout Psych: Normal mood, affect, hygiene and grooming.  ASSESSMENT/PLAN: Routine general medical examination at a health care facility  Non-seasonal allergic rhinitis due to pollen  Mild intermittent asthma without complication  Borderline hyperlipidemia  Hormone replacement therapy (HRT) She will continue on her allergy medications as well as HRT.  Continue to monitor her lipids.   Discussed monthly self breast exams and yearly mammograms; at least 30 minutes of aerobic activity at least 5 days/week and weight-bearing exercise 2x/week; proper sunscreen use reviewed; healthy diet, Immunization recommendations discussed.  Colonoscopy recommendations reviewed   Medicare Attestation I have personally reviewed: The patient's medical and social history Their use of alcohol, tobacco or illicit drugs Their current medications and supplements The patient's functional ability including ADLs,fall risks, home safety risks, cognitive, and hearing and visual impairment Diet and physical activities Evidence for depression or mood disorders  The patient's weight, height, and BMI have been recorded in the chart.  I have made referrals, counseling, and provided education to the patient  based on review of the above and I have provided the patient with a written personalized care plan for preventive services.     Sharlot Gowda, MD   04/29/2020

## 2020-04-30 ENCOUNTER — Ambulatory Visit: Payer: Medicare Other | Admitting: Family Medicine

## 2020-04-30 LAB — CBC WITH DIFFERENTIAL/PLATELET
Basophils Absolute: 0 10*3/uL (ref 0.0–0.2)
Basos: 1 %
EOS (ABSOLUTE): 0.2 10*3/uL (ref 0.0–0.4)
Eos: 3 %
Hematocrit: 49.3 % — ABNORMAL HIGH (ref 34.0–46.6)
Hemoglobin: 16.4 g/dL — ABNORMAL HIGH (ref 11.1–15.9)
Immature Grans (Abs): 0 10*3/uL (ref 0.0–0.1)
Immature Granulocytes: 0 %
Lymphocytes Absolute: 2.2 10*3/uL (ref 0.7–3.1)
Lymphs: 38 %
MCH: 29.7 pg (ref 26.6–33.0)
MCHC: 33.3 g/dL (ref 31.5–35.7)
MCV: 89 fL (ref 79–97)
Monocytes Absolute: 0.5 10*3/uL (ref 0.1–0.9)
Monocytes: 9 %
Neutrophils Absolute: 2.9 10*3/uL (ref 1.4–7.0)
Neutrophils: 49 %
Platelets: 313 10*3/uL (ref 150–450)
RBC: 5.52 x10E6/uL — ABNORMAL HIGH (ref 3.77–5.28)
RDW: 12.7 % (ref 11.7–15.4)
WBC: 5.9 10*3/uL (ref 3.4–10.8)

## 2020-04-30 LAB — COMPREHENSIVE METABOLIC PANEL
ALT: 18 IU/L (ref 0–32)
AST: 19 IU/L (ref 0–40)
Albumin/Globulin Ratio: 1.6 (ref 1.2–2.2)
Albumin: 4.4 g/dL (ref 3.7–4.7)
Alkaline Phosphatase: 75 IU/L (ref 44–121)
BUN/Creatinine Ratio: 24 (ref 12–28)
BUN: 18 mg/dL (ref 8–27)
Bilirubin Total: 0.5 mg/dL (ref 0.0–1.2)
CO2: 26 mmol/L (ref 20–29)
Calcium: 9.5 mg/dL (ref 8.7–10.3)
Chloride: 102 mmol/L (ref 96–106)
Creatinine, Ser: 0.75 mg/dL (ref 0.57–1.00)
GFR calc Af Amer: 91 mL/min/{1.73_m2} (ref >59)
GFR calc non Af Amer: 79 mL/min/{1.73_m2} (ref >59)
Globulin, Total: 2.8 g/dL (ref 1.5–4.5)
Glucose: 94 mg/dL (ref 65–99)
Potassium: 4.6 mmol/L (ref 3.5–5.2)
Sodium: 142 mmol/L (ref 134–144)
Total Protein: 7.2 g/dL (ref 6.0–8.5)

## 2020-04-30 LAB — LIPID PANEL
Chol/HDL Ratio: 4.2 ratio (ref 0.0–4.4)
Cholesterol, Total: 266 mg/dL — ABNORMAL HIGH (ref 100–199)
HDL: 63 mg/dL (ref >39)
LDL Chol Calc (NIH): 189 mg/dL — ABNORMAL HIGH (ref 0–99)
Triglycerides: 85 mg/dL (ref 0–149)
VLDL Cholesterol Cal: 14 mg/dL (ref 5–40)

## 2020-06-25 DIAGNOSIS — Z1231 Encounter for screening mammogram for malignant neoplasm of breast: Secondary | ICD-10-CM | POA: Diagnosis not present

## 2020-06-25 LAB — HM MAMMOGRAPHY

## 2020-07-17 ENCOUNTER — Encounter: Payer: Self-pay | Admitting: Family Medicine

## 2020-09-11 DIAGNOSIS — H52223 Regular astigmatism, bilateral: Secondary | ICD-10-CM | POA: Diagnosis not present

## 2020-11-30 DIAGNOSIS — D2272 Melanocytic nevi of left lower limb, including hip: Secondary | ICD-10-CM | POA: Diagnosis not present

## 2020-11-30 DIAGNOSIS — D1801 Hemangioma of skin and subcutaneous tissue: Secondary | ICD-10-CM | POA: Diagnosis not present

## 2020-11-30 DIAGNOSIS — L821 Other seborrheic keratosis: Secondary | ICD-10-CM | POA: Diagnosis not present

## 2020-11-30 DIAGNOSIS — D225 Melanocytic nevi of trunk: Secondary | ICD-10-CM | POA: Diagnosis not present

## 2020-12-23 ENCOUNTER — Telehealth: Payer: Self-pay | Admitting: Family Medicine

## 2020-12-23 NOTE — Telephone Encounter (Signed)
Please call pt has questions about covid booster  She states she received pfizer booster here in November and "had terrible reaction" Per pt 1 1/2 days later had nausea, stomach issues, had goose egg size know arm pit area that lasted 10 + weeks  She is questioning if she should get another booster because of reaction and if so should she change to [facility]

## 2020-12-23 NOTE — Telephone Encounter (Signed)
Left message for pt with Dr. Jola Babinski response on covid booster

## 2021-01-15 ENCOUNTER — Ambulatory Visit (INDEPENDENT_AMBULATORY_CARE_PROVIDER_SITE_OTHER): Payer: Medicare Other | Admitting: Family Medicine

## 2021-01-15 ENCOUNTER — Other Ambulatory Visit: Payer: Self-pay

## 2021-01-15 VITALS — BP 116/78 | HR 64 | Temp 97.4°F | Wt 163.4 lb

## 2021-01-15 DIAGNOSIS — N3001 Acute cystitis with hematuria: Secondary | ICD-10-CM

## 2021-01-15 DIAGNOSIS — R399 Unspecified symptoms and signs involving the genitourinary system: Secondary | ICD-10-CM

## 2021-01-15 LAB — POCT URINALYSIS DIP (PROADVANTAGE DEVICE)
Bilirubin, UA: NEGATIVE
Glucose, UA: NEGATIVE mg/dL
Ketones, POC UA: NEGATIVE mg/dL
Nitrite, UA: NEGATIVE
Protein Ur, POC: 30 mg/dL — AB
Specific Gravity, Urine: 1.03
Urobilinogen, Ur: 0.2
pH, UA: 6 (ref 5.0–8.0)

## 2021-01-15 MED ORDER — DOXYCYCLINE HYCLATE 100 MG PO TABS: 100.0000 mg | ORAL_TABLET | Freq: Two times a day (BID) | ORAL | 0 refills | Status: DC

## 2021-01-15 NOTE — Progress Notes (Signed)
   Subjective:    Patient ID: Alexandria [name], female    DOB: April 29, 1946, 75 y.o.   MRN: [id number]  HPI She complains of a 3-day history of cloudy urine, dysuria, urgency and frequency no fever or chills.   Review of Systems     Objective:   Physical Exam Alert and in no distress.  Urine dipstick was positive for a leukocytes and blood.  Not enough blood to do a microscopic.       Assessment & Plan:  Acute cystitis with hematuria - Plan: doxycycline (VIBRA-TABS) 100 MG tablet  UTI symptoms - Plan: POCT Urinalysis DIP (Proadvantage Device) She is allergic to sulfa and I will therefore give her Vibra-Tabs.  She is to return here in 2 weeks for recheck on her urine.

## 2021-01-15 NOTE — Patient Instructions (Signed)
If the burning gets worse try a medication called Azo Standard that you can get over-the-counter

## 2021-01-25 ENCOUNTER — Other Ambulatory Visit: Payer: Self-pay

## 2021-01-25 ENCOUNTER — Ambulatory Visit: Payer: Medicare Other | Admitting: Podiatry

## 2021-01-25 ENCOUNTER — Ambulatory Visit (INDEPENDENT_AMBULATORY_CARE_PROVIDER_SITE_OTHER): Payer: Medicare Other

## 2021-01-25 DIAGNOSIS — M2011 Hallux valgus (acquired), right foot: Secondary | ICD-10-CM

## 2021-01-25 DIAGNOSIS — M2042 Other hammer toe(s) (acquired), left foot: Secondary | ICD-10-CM

## 2021-01-29 ENCOUNTER — Other Ambulatory Visit (INDEPENDENT_AMBULATORY_CARE_PROVIDER_SITE_OTHER): Payer: Medicare Other

## 2021-01-29 ENCOUNTER — Other Ambulatory Visit: Payer: Self-pay

## 2021-01-29 ENCOUNTER — Telehealth: Payer: Self-pay

## 2021-01-29 DIAGNOSIS — R399 Unspecified symptoms and signs involving the genitourinary system: Secondary | ICD-10-CM | POA: Diagnosis not present

## 2021-01-29 LAB — POCT URINALYSIS DIP (PROADVANTAGE DEVICE)
Bilirubin, UA: NEGATIVE
Blood, UA: NEGATIVE
Glucose, UA: NEGATIVE mg/dL
Ketones, POC UA: NEGATIVE mg/dL
Leukocytes, UA: NEGATIVE
Nitrite, UA: NEGATIVE
Protein Ur, POC: NEGATIVE mg/dL
Specific Gravity, Urine: 1.02
Urobilinogen, Ur: 0.2
pH, UA: 6 (ref 5.0–8.0)

## 2021-01-29 NOTE — Progress Notes (Signed)
Will defer to Dr. Susann Givens for results. KH

## 2021-01-29 NOTE — Telephone Encounter (Signed)
Pt came in for repeat U/A . Results are in. Please advise . KH

## 2021-02-02 NOTE — Progress Notes (Signed)
   Subjective: 75 y.o. female presenting today as a new patient for evaluation of a bunion and hammertoe to the bilateral feet.  This is been ongoing for several years with a gradual onset.  Patient states that currently the bunions are not painful or symptomatic.  Patient is somewhat concerned about the second toe that it is shifting into the hallux.  Again, this is not symptomatic but she would like to have it is dressed.  She presents for further treatment and evaluation  Past Medical History:  Diagnosis Date   Allergy    Asthma, mild intermittent 02/01/2016    Objective: Physical Exam General: The patient is alert and oriented x3 in no acute distress.  Dermatology: Skin is cool, dry and supple bilateral lower extremities. Negative for open lesions or macerations.  Vascular: Palpable pedal pulses bilaterally. No edema or erythema noted. Capillary refill within normal limits.  Neurological: Epicritic and protective threshold grossly intact bilaterally.   Musculoskeletal Exam: Clinical evidence of bunion deformity noted to the respective foot. There is no pain on palpation range of motion of the first MPJ. Lateral deviation of the hallux noted consistent with hallux abductovalgus. Hammertoe contracture also noted on clinical exam to digits 2 and 3 of the right foot.  There is no pain on palpation and range of motion also noted to the metatarsal phalangeal joints of the respective hammertoe digits and the second toe does slightly deviate medially underneath the hallux with loadbearing exam  Radiographic Exam: Patient declined x-rays today    Assessment: 1. HAV w/ bunion deformity bilateral; asymptomatic 2. Hammertoe deformity 2, 3 right; asymptomatic   Plan of Care:  1. Patient was evaluated.  2.  Today we discussed surgical versus conservative management of the patient's deformities.  Currently they are completely asymptomatic so do not recommend surgery at this time.  Recommend  conservative treatment including wide fitting shoes. 3.  Recommend good arch supports. 4.  Return to clinic as needed    Felecia Shelling, DPM [facility]  Dr. Felecia Shelling, DPM    [address] [place], [place] [address]                Office [phone]  Fax [phone]

## 2021-03-03 ENCOUNTER — Other Ambulatory Visit (INDEPENDENT_AMBULATORY_CARE_PROVIDER_SITE_OTHER): Payer: Medicare Other

## 2021-03-03 ENCOUNTER — Other Ambulatory Visit: Payer: Self-pay

## 2021-03-03 ENCOUNTER — Telehealth (INDEPENDENT_AMBULATORY_CARE_PROVIDER_SITE_OTHER): Payer: Medicare Other | Admitting: Medical

## 2021-03-03 ENCOUNTER — Encounter: Payer: Self-pay | Admitting: Medical

## 2021-03-03 VITALS — Temp 97.4°F | Wt 160.0 lb

## 2021-03-03 DIAGNOSIS — R059 Cough, unspecified: Secondary | ICD-10-CM | POA: Diagnosis not present

## 2021-03-03 DIAGNOSIS — J988 Other specified respiratory disorders: Secondary | ICD-10-CM | POA: Diagnosis not present

## 2021-03-03 DIAGNOSIS — R062 Wheezing: Secondary | ICD-10-CM

## 2021-03-03 LAB — POC COVID19 BINAXNOW: SARS Coronavirus 2 Ag: NEGATIVE

## 2021-03-03 MED ORDER — PREDNISONE 10 MG PO TABS: ORAL_TABLET | ORAL | 0 refills | Status: DC

## 2021-03-03 MED ORDER — HYDROCODONE BIT-HOMATROP MBR 5-1.5 MG/5ML PO SOLN: 5.0000 mL | Freq: Three times a day (TID) | ORAL | 0 refills | Status: AC | PRN

## 2021-03-03 MED ORDER — ALBUTEROL SULFATE HFA 108 (90 BASE) MCG/ACT IN AERS: 2.0000 | INHALATION_SPRAY | Freq: Four times a day (QID) | RESPIRATORY_TRACT | 0 refills | Status: DC | PRN

## 2021-03-03 MED ORDER — AMOXICILLIN 875 MG PO TABS: 875.0000 mg | ORAL_TABLET | Freq: Two times a day (BID) | ORAL | 0 refills | Status: AC

## 2021-03-03 MED ORDER — PREDNISONE 10 MG PO TABS
ORAL_TABLET | ORAL | 0 refills | Status: DC
Start: 2021-03-03 — End: 2021-03-03

## 2021-03-03 NOTE — Progress Notes (Signed)
Subjective:     Patient ID: Alexandria [name], female   DOB: [birth year], 75 y.o.   MRN: [id number]  This visit type was conducted due to national recommendations for restrictions regarding the COVID-19 Pandemic (e.g. social distancing) in an effort to limit this patient's exposure and mitigate transmission in our community.  Due to their co-morbid illnesses, this patient is at least at moderate risk for complications without adequate follow up.  This format is felt to be most appropriate for this patient at this time.    Documentation for virtual audio and video telecommunications through [facility] encounter:  The patient was located at home. The provider was located in the office. The patient did consent to this visit and is aware of possible charges through their insurance for this visit.  The other persons participating in this telemedicine service were none. Time spent on call was 20 minutes and in review of previous records 20 minutes total.  This virtual service is not related to other E/M service within previous 7 days.   HPI Chief Complaint  Patient presents with   not feeling well    Congested since Friday, cough nonstop, spitting phelgm that green color. Taking allegra and sudafed. Every spring or fall she gets this   Virtual consult for not feeling well.  In genera takes allergy medicaiton spring and fall.   Having some allergy problems recently but feels like things moving to sinus infection. She had to be outside prolonged this past weekend around some constrction dust.   By the time she walked around [place], head was heavy and hurting from congestion.  Started some OTC sudafed.  She notes hx/o sinus infection, can lead into bronchitis somewhat easy.  Feels a little wheezy.  Using flonase ,saline solution as well.  Constantly blowing nose.  Using oral allegra.  No fever. No nausea or vomiting.   No sick contacts.  No recent covid test.   Wants regimen of cough  medicaiton and antibiotic she has used prior.  No other aggravating or relieving factors. No other complaint.  Past Medical History:  Diagnosis Date   Allergy    Asthma, mild intermittent 02/01/2016   Current Outpatient Medications on File Prior to Visit  Medication Sig Dispense Refill   Biotin 5000 MCG CAPS Take by mouth.     calcium carbonate (OSCAL) 1500 (600 Ca) MG TABS tablet Take 600 mg of elemental calcium by mouth 2 (two) times daily with a meal.     cholecalciferol (VITAMIN D) 1000 units tablet Take 2,000 Units by mouth daily.     estradiol (CLIMARA - DOSED IN MG/24 HR) 0.1 mg/24hr patch 0.1 mg once a week.     fluticasone (FLONASE) 50 MCG/ACT nasal spray Place 2 sprays into both nostrils daily.     ipratropium (ATROVENT) 0.03 % nasal spray Place 2 sprays into both nostrils every 12 (twelve) hours. 30 mL 3   loratadine (CLARITIN) 10 MG tablet Take 10 mg by mouth daily.     phenylephrine (SUDAFED PE) 10 MG TABS tablet Take 10 mg by mouth every 4 (four) hours as needed.     pyridOXINE (VITAMIN B-6) 100 MG tablet Take 100 mg by mouth daily.     vitamin C (ASCORBIC ACID) 500 MG tablet Take 500 mg by mouth daily.     zolpidem (AMBIEN) 10 MG tablet Take 10 mg by mouth at bedtime as needed.     albuterol (PROVENTIL HFA;VENTOLIN HFA) 108 (90 Base) MCG/ACT inhaler Inhale 2  puffs into the lungs every 6 (six) hours as needed for wheezing or shortness of breath. (Patient not taking: Reported on 03/03/2021) 1 Inhaler 0   albuterol (PROVENTIL) (2.5 MG/3ML) 0.083% nebulizer solution Take 3 mLs (2.5 mg total) by nebulization every 6 (six) hours as needed for wheezing or shortness of breath. (Patient not taking: No sig reported) 75 mL 2   hydrocortisone 2.5 % cream Apply topically. (Patient not taking: Reported on 03/03/2021)     No current facility-administered medications on file prior to visit.     Review of Systems As in subjective    Objective:   Physical Exam Due to coronavirus pandemic  stay at home measures, patient visit was virtual and they were not examined in person.   Temp (!) 97.4 F (36.3 C)   Wt 160 lb (72.6 kg)   BMI 27.46 kg/m   Gen: nad Sounds congested, but no labored breathing or wheezing Otherwise not examined     Assessment:     Encounter Diagnoses  Name Primary?   Cough Yes   Respiratory tract infection    Wheezing        Plan:     We discussed symptoms and concerns.  I advised that I cannot completely rule out COVID.  She will come into our back parking lot this morning for COVID testing.  Advised rest, hydration, continue her routine allergy medication.  I refilled albuterol she can use for tightness and wheezing follow-up with its.  We discussed proper use of this.  Also sent Hycodan cough medication she can use as needed.  We discussed the risk and benefits and proper use of that medicine as well.  We discussed prednisone but for right now she wants to hold off on this.  She initially felt like albuterol does not help her which is why we discussed the prednisone given the wheezing.  However after discussion of proper use of medicine she is going to work to see if the albuterol helps  We discussed that this may just be viral.  She was fairly adamant about antibiotic.  I sent amoxicillin as a backup in case thick green mucus discharge continues or sinus pressure gets worse.  Consider chest x-ray  Follow-up pending COVID testing  [place] was seen today for not feeling well.  Diagnoses and all orders for this visit:  Cough  Respiratory tract infection  Wheezing  Other orders -     HYDROcodone bit-homatropine (HYCODAN) 5-1.5 MG/5ML syrup; Take 5 mLs by mouth every 8 (eight) hours as needed for up to 5 days for cough. -     Discontinue: predniSONE (DELTASONE) 10 MG tablet; 6 tablets day 1, 5 tablets day 2, 4 tablets day 3, 3 tablets day 4, 2 tablets day 5, 1 tablet day 6 -     albuterol (VENTOLIN HFA) 108 (90 Base) MCG/ACT inhaler;  Inhale 2 puffs into the lungs every 6 (six) hours as needed for wheezing or shortness of breath. -     predniSONE (DELTASONE) 10 MG tablet; 6 tablets day 1, 5 tablets day 2, 4 tablets day 3, 3 tablets day 4, 2 tablets day 5, 1 tablet day 6 -     amoxicillin (AMOXIL) 875 MG tablet; Take 1 tablet (875 mg total) by mouth 2 (two) times daily for 10 days.  F/u in our back parking lot today for covid screening.

## 2021-03-04 ENCOUNTER — Other Ambulatory Visit: Payer: Self-pay | Admitting: Medical

## 2021-03-04 LAB — NOVEL CORONAVIRUS, NAA: SARS-CoV-2, NAA: DETECTED — AB

## 2021-03-04 LAB — SARS-COV-2, NAA 2 DAY TAT

## 2021-03-04 MED ORDER — MOLNUPIRAVIR EUA 200MG CAPSULE: 4.0000 | ORAL_CAPSULE | Freq: Two times a day (BID) | ORAL | 0 refills | Status: AC

## 2021-05-03 ENCOUNTER — Ambulatory Visit (INDEPENDENT_AMBULATORY_CARE_PROVIDER_SITE_OTHER): Payer: Medicare Other | Admitting: Family Medicine

## 2021-05-03 ENCOUNTER — Other Ambulatory Visit: Payer: Self-pay

## 2021-05-03 ENCOUNTER — Encounter: Payer: Self-pay | Admitting: Family Medicine

## 2021-05-03 VITALS — BP 146/80 | HR 64 | Temp 96.9°F | Ht 63.25 in | Wt 162.8 lb

## 2021-05-03 DIAGNOSIS — J4520 Mild intermittent asthma, uncomplicated: Secondary | ICD-10-CM | POA: Diagnosis not present

## 2021-05-03 DIAGNOSIS — Z0000 Encounter for general adult medical examination without abnormal findings: Secondary | ICD-10-CM | POA: Diagnosis not present

## 2021-05-03 DIAGNOSIS — J301 Allergic rhinitis due to pollen: Secondary | ICD-10-CM | POA: Diagnosis not present

## 2021-05-03 DIAGNOSIS — Z79890 Hormone replacement therapy (postmenopausal): Secondary | ICD-10-CM

## 2021-05-03 DIAGNOSIS — E785 Hyperlipidemia, unspecified: Secondary | ICD-10-CM | POA: Diagnosis not present

## 2021-05-03 DIAGNOSIS — Z8616 Personal history of COVID-19: Secondary | ICD-10-CM | POA: Insufficient documentation

## 2021-05-03 NOTE — Progress Notes (Signed)
   Subjective:    Patient ID: Alexandria [name], female    DOB: 1946/04/19, 75 y.o.   MRN: [id number]  HPI She is here for complete examination.  She has no particular concerns or complaints.  She is on HRT given to her by her gynecologist and is helping with her menopausal type symptoms.  Her allergies seem to be under good control with Flonase and Claritin.  She rarely uses her albuterol.  She is on multivitamins.  She has had a hysterectomy.  She rarely has difficulty with her sleep but does have Ambien given to her by her gynecologist.  She does not smoke and drinks rarely.  Family and social history as well as health maintenance and immunizations was reviewed.   Review of Systems  All other systems reviewed and are negative.     Objective:   Physical Exam Alert and in no distress. Tympanic membranes and canals are normal. Pharyngeal area is normal. Neck is supple without adenopathy or thyromegaly. Cardiac exam shows a regular sinus rhythm without murmurs or gallops. Lungs are clear to auscultation.       Assessment & Plan:  Routine general medical examination at a health care facility - Plan: CBC with Differential/Platelet, Comprehensive metabolic panel, Lipid panel  Non-seasonal allergic rhinitis due to pollen  Mild intermittent asthma without complication  Borderline hyperlipidemia - Plan: Lipid panel  Hormone replacement therapy (HRT)  History of COVID-19 Courage her to continue to take good care of her self.  When she needs a refill on her Flonase, she will call me.

## 2021-05-04 LAB — CBC WITH DIFFERENTIAL/PLATELET
Basophils Absolute: 0 10*3/uL (ref 0.0–0.2)
Basos: 1 %
EOS (ABSOLUTE): 0.2 10*3/uL (ref 0.0–0.4)
Eos: 3 %
Hematocrit: 46 % (ref 34.0–46.6)
Hemoglobin: 16.1 g/dL — ABNORMAL HIGH (ref 11.1–15.9)
Immature Grans (Abs): 0 10*3/uL (ref 0.0–0.1)
Immature Granulocytes: 0 %
Lymphocytes Absolute: 1.9 10*3/uL (ref 0.7–3.1)
Lymphs: 27 %
MCH: 30.4 pg (ref 26.6–33.0)
MCHC: 35 g/dL (ref 31.5–35.7)
MCV: 87 fL (ref 79–97)
Monocytes Absolute: 0.6 10*3/uL (ref 0.1–0.9)
Monocytes: 8 %
Neutrophils Absolute: 4.3 10*3/uL (ref 1.4–7.0)
Neutrophils: 61 %
Platelets: 274 10*3/uL (ref 150–450)
RBC: 5.3 x10E6/uL — ABNORMAL HIGH (ref 3.77–5.28)
RDW: 12.5 % (ref 11.7–15.4)
WBC: 7 10*3/uL (ref 3.4–10.8)

## 2021-05-04 LAB — LIPID PANEL
Chol/HDL Ratio: 4.1 ratio (ref 0.0–4.4)
Cholesterol, Total: 215 mg/dL — ABNORMAL HIGH (ref 100–199)
HDL: 52 mg/dL (ref >39)
LDL Chol Calc (NIH): 149 mg/dL — ABNORMAL HIGH (ref 0–99)
Triglycerides: 78 mg/dL (ref 0–149)
VLDL Cholesterol Cal: 14 mg/dL (ref 5–40)

## 2021-05-04 LAB — COMPREHENSIVE METABOLIC PANEL
ALT: 15 IU/L (ref 0–32)
AST: 19 IU/L (ref 0–40)
Albumin/Globulin Ratio: 1.8 (ref 1.2–2.2)
Albumin: 4.2 g/dL (ref 3.7–4.7)
Alkaline Phosphatase: 71 IU/L (ref 44–121)
BUN/Creatinine Ratio: 26 (ref 12–28)
BUN: 18 mg/dL (ref 8–27)
Bilirubin Total: 0.5 mg/dL (ref 0.0–1.2)
CO2: 25 mmol/L (ref 20–29)
Calcium: 9.4 mg/dL (ref 8.7–10.3)
Chloride: 103 mmol/L (ref 96–106)
Creatinine, Ser: 0.69 mg/dL (ref 0.57–1.00)
Globulin, Total: 2.4 g/dL (ref 1.5–4.5)
Glucose: 93 mg/dL (ref 70–99)
Potassium: 5 mmol/L (ref 3.5–5.2)
Sodium: 141 mmol/L (ref 134–144)
Total Protein: 6.6 g/dL (ref 6.0–8.5)
eGFR: 90 mL/min/{1.73_m2} (ref >59)

## 2021-06-09 ENCOUNTER — Other Ambulatory Visit: Payer: Self-pay

## 2021-06-09 ENCOUNTER — Encounter: Payer: Self-pay | Admitting: Medical

## 2021-06-09 ENCOUNTER — Ambulatory Visit (INDEPENDENT_AMBULATORY_CARE_PROVIDER_SITE_OTHER): Payer: Medicare Other | Admitting: Medical

## 2021-06-09 VITALS — BP 128/82 | HR 72 | Wt 164.8 lb

## 2021-06-09 DIAGNOSIS — R229 Localized swelling, mass and lump, unspecified: Secondary | ICD-10-CM | POA: Diagnosis not present

## 2021-06-09 DIAGNOSIS — H9202 Otalgia, left ear: Secondary | ICD-10-CM

## 2021-06-09 DIAGNOSIS — M2669 Other specified disorders of temporomandibular joint: Secondary | ICD-10-CM

## 2021-06-09 NOTE — Progress Notes (Signed)
Subjective:  Alexandria [name] is a 75 y.o. female who presents for Chief Complaint  Patient presents with   Ear Fullness     Left ear fullness for 1 days     Here for left ear pain/fullness since yesterday. First noticed pain when she was eating a banana.    No ear drainage.  Has pain touching tragus.  No teeth pain.  No fever, no congested.  Used some Claritin and Flonase.  No fever no other upper respiratory symptoms.  Otherwise normal state of health.  No other aggravating or relieving factors.    No other c/o.  The following portions of the patient's history were reviewed and updated as appropriate: allergies, current medications, past family history, past medical history, past social history, past surgical history and problem list.  ROS Otherwise as in subjective above  Objective: BP 128/82 (BP Location: Right Arm, Patient Position: Sitting)    Pulse 72    Wt 164 lb 12.8 oz (74.8 kg)    SpO2 98%    BMI 28.96 kg/m   General appearance: alert, no distress, well developed, well nourished HEENT: normocephalic, sclerae anicteric, conjunctiva pink and moist, impacted cerumen bilaterally, slightly puffy and tender tragus on the left, slightly tender of left TMJ, nares patent, no discharge or erythema, pharynx normal Oral cavity: MMM, no lesions Neck: supple, no lymphadenopathy, no thyromegaly, no masses    Assessment: Encounter Diagnoses  Name Primary?   Ear pain, left Yes   TMJ inflammation    Soft tissue swelling      Plan: We discussed symptoms and concerns.  She would like ear lavage given the wax.  Discussed findings.  Discussed risk/benefits of procedure and patient agrees to procedure. Successfully used warm water lavage to remove impacted cerumen from bilat ear canal. Patient tolerated procedure well. Advised they avoid using any cotton swabs or other devices to clean the ear canals.  Use basic hygiene as discussed.  Follow up prn.    She also has soft tissue  swelling of the left tragus and TMJ tenderness.  Advise cool pack 20 minutes on 20 minutes off to the left ear and face the next few days, over-the-counter ibuprofen twice a day the next few days.  Use jaw rest.  If not much improved within a week then recheck  Alexandria [name] was seen today for ear fullness.  Diagnoses and all orders for this visit:  Ear pain, left  TMJ inflammation  Soft tissue swelling    Follow up: As needed

## 2021-07-01 DIAGNOSIS — Z1231 Encounter for screening mammogram for malignant neoplasm of breast: Secondary | ICD-10-CM | POA: Diagnosis not present

## 2021-07-01 LAB — HM MAMMOGRAPHY

## 2021-08-16 ENCOUNTER — Telehealth: Payer: Self-pay | Admitting: Family Medicine

## 2021-08-16 ENCOUNTER — Other Ambulatory Visit: Payer: Self-pay | Admitting: Medical

## 2021-08-16 MED ORDER — IPRATROPIUM BROMIDE 0.03 % NA SOLN: 2.0000 | Freq: Two times a day (BID) | NASAL | 1 refills | Status: DC

## 2021-08-16 NOTE — Telephone Encounter (Signed)
Pt called and is requesting a refill on her atrovent please send to [facility] [address] [place], [place] - [address]

## 2021-10-08 DIAGNOSIS — H52203 Unspecified astigmatism, bilateral: Secondary | ICD-10-CM | POA: Diagnosis not present

## 2021-11-12 ENCOUNTER — Encounter: Payer: Self-pay | Admitting: Physician Assistant

## 2021-11-12 ENCOUNTER — Telehealth: Payer: Self-pay | Admitting: Physician Assistant

## 2021-11-12 ENCOUNTER — Telehealth (INDEPENDENT_AMBULATORY_CARE_PROVIDER_SITE_OTHER): Payer: Medicare Other | Admitting: Physician Assistant

## 2021-11-12 DIAGNOSIS — J301 Allergic rhinitis due to pollen: Secondary | ICD-10-CM | POA: Diagnosis not present

## 2021-11-12 DIAGNOSIS — R058 Other specified cough: Secondary | ICD-10-CM | POA: Diagnosis not present

## 2021-11-12 DIAGNOSIS — J4520 Mild intermittent asthma, uncomplicated: Secondary | ICD-10-CM | POA: Diagnosis not present

## 2021-11-12 MED ORDER — AZITHROMYCIN 250 MG PO TABS: ORAL_TABLET | ORAL | 0 refills | Status: AC

## 2021-11-12 MED ORDER — METHYLPREDNISOLONE 4 MG PO TBPK: ORAL_TABLET | ORAL | 0 refills | Status: DC

## 2021-11-12 MED ORDER — HYDROCODONE BIT-HOMATROP MBR 5-1.5 MG/5ML PO SOLN: 5.0000 mL | Freq: Three times a day (TID) | ORAL | 0 refills | Status: AC | PRN

## 2021-11-12 MED ORDER — HYDROCODONE BIT-HOMATROP MBR 5-1.5 MG/5ML PO SOLN: 5.0000 mL | Freq: Three times a day (TID) | ORAL | 0 refills | Status: DC | PRN

## 2021-11-12 NOTE — Telephone Encounter (Signed)
Pt states pharmacy was out of the hydrocodone cough syrup on [address] and she called [facility] on [address] and they do have it. Can you switch pharmacies just for the cough syrup.

## 2021-11-12 NOTE — Progress Notes (Unsigned)
Start time: 12:05 pm End time:  Virtual Visit via Video Note   Patient ID: Alexandria [name], female    DOB: [birth year], 76 y.o.   MRN: [id number]  I connected with above patient on 11/12/21 by a video enabled telemedicine application and verified that I am speaking with the correct person using two identifiers.  Location: Patient: home Provider: office   I discussed the limitations of evaluation and management by telemedicine and the availability of in person appointments. The patient expressed understanding and agreed to proceed.  History of Present Illness:  Chief Complaint  Patient presents with   Acute Visit    Virtual- Sore throat and coughing. Covid test negative as of yesterday. Chest burns when she coughs.   2 day history of a runny nose, post nasal drip, hx/o allergic rhinitis has been outside planting flowers, coughing non-productive, worse last night had to elevate with pillows; chest is burning with cough; took cough syrup from previous infection so took that last night; allegra, throat is red; home COVID test yesterday was negative; denies fever; some fatigue; denies recent travel, denies sick contacts,  Denies fever/chills, nausea/vomiting, diarrhea/constipation.     Observations/Objective:  Temp 97.8 F (36.6 C)   Wt 164 lb (74.4 kg)   BMI 28.82 kg/m    Assessment: Encounter Diagnoses  Name Primary?   Non-seasonal allergic rhinitis due to pollen Yes   Mild intermittent asthma without complication    Cough productive of purulent sputum      Plan: ***  Alexandria [name] was seen today for acute visit.  Diagnoses and all orders for this visit:  Non-seasonal allergic rhinitis due to pollen  Mild intermittent asthma without complication  Cough productive of purulent sputum    Follow up:    I discussed the assessment and treatment plan with the patient. The patient was provided an opportunity to ask questions and all were answered. The patient agreed with the  plan and demonstrated an understanding of the instructions.   The patient was advised to call back or seek an in-person evaluation if the symptoms worsen or if the condition fails to improve as anticipated. For emergencies go to Urgent Care or the Emergency Department for immediate evaluation.   I spent 15 minutes dedicated to the care of this patient, including pre-visit review of records, face to face time, post-visit ordering of testing and documentation.  I have logged into and reviewed this patient's PMPAware data today prior to issuing prescriptions for any controlled substances.   Jake Shark, PA-C

## 2021-11-12 NOTE — Telephone Encounter (Signed)
Prescription for hycodan cough syrup sent to [facility], [employer]. Thanks.

## 2021-11-15 ENCOUNTER — Encounter: Payer: Self-pay | Admitting: Physician Assistant

## 2021-11-15 NOTE — Assessment & Plan Note (Signed)
Stable, OTC antihistamine, nasal steroid spray, and expectorant as needed; if worse can refer to Allergist for further evaluation and allergy testing ? ?

## 2021-11-15 NOTE — Assessment & Plan Note (Signed)
Stable, will monitor 

## 2021-11-15 NOTE — Patient Instructions (Signed)
You can take OTC cough suppressant as needed like dextromethorphan (DM) in any OTC cough medicine that has the abbreviation DM.  

## 2021-12-17 ENCOUNTER — Ambulatory Visit (INDEPENDENT_AMBULATORY_CARE_PROVIDER_SITE_OTHER): Payer: Medicare Other

## 2021-12-17 VITALS — Ht 63.75 in | Wt 163.0 lb

## 2021-12-17 DIAGNOSIS — Z0000 Encounter for general adult medical examination without abnormal findings: Secondary | ICD-10-CM | POA: Diagnosis not present

## 2021-12-17 NOTE — Patient Instructions (Signed)
Alexandria [name] , Thank you for taking time to come for your Medicare Wellness Visit. I appreciate your ongoing commitment to your health goals. Please review the following plan we discussed and let me know if I can assist you in the future.   Screening recommendations/referrals: Colonoscopy: cologuard 04/05/2019, due 04/04/2022 Mammogram: completed 07/01/2021, due 07/02/2022 Bone Density: completed 01/07/2019 Recommended yearly ophthalmology/optometry visit for glaucoma screening and checkup Recommended yearly dental visit for hygiene and checkup  Vaccinations: Influenza vaccine: due 01/18/2022 Pneumococcal vaccine: completed 04/23/2018 Tdap vaccine: allergy Shingles vaccine: completed   Covid-19: 04/29/2020, 07/29/2019, 07/09/2019  Advanced directives: copy in chart  Conditions/risks identified: none  Next appointment: Follow up in one year for your annual wellness visit    Preventive Care 65 Years and Older, Female Preventive care refers to lifestyle choices and visits with your health care provider that can promote health and wellness. What does preventive care include? A yearly physical exam. This is also called an annual well check. Dental exams once or twice a year. Routine eye exams. Ask your health care provider how often you should have your eyes checked. Personal lifestyle choices, including: Daily care of your teeth and gums. Regular physical activity. Eating a healthy diet. Avoiding tobacco and drug use. Limiting alcohol use. Practicing safe sex. Taking low-dose aspirin every day. Taking vitamin and mineral supplements as recommended by your health care provider. What happens during an annual well check? The services and screenings done by your health care provider during your annual well check will depend on your age, overall health, lifestyle risk factors, and family history of disease. Counseling  Your health care provider may ask you questions about your: Alcohol  use. Tobacco use. Drug use. Emotional well-being. Home and relationship well-being. Sexual activity. Eating habits. History of falls. Memory and ability to understand (cognition). Work and work Astronomer. Reproductive health. Screening  You may have the following tests or measurements: Height, weight, and BMI. Blood pressure. Lipid and cholesterol levels. These may be checked every 5 years, or more frequently if you are over 80 years old. Skin check. Lung cancer screening. You may have this screening every year starting at age 53 if you have a 30-pack-year history of smoking and currently smoke or have quit within the past 15 years. Fecal occult blood test (FOBT) of the stool. You may have this test every year starting at age 80. Flexible sigmoidoscopy or colonoscopy. You may have a sigmoidoscopy every 5 years or a colonoscopy every 10 years starting at age 59. Hepatitis C blood test. Hepatitis B blood test. Sexually transmitted disease (STD) testing. Diabetes screening. This is done by checking your blood sugar (glucose) after you have not eaten for a while (fasting). You may have this done every 1-3 years. Bone density scan. This is done to screen for osteoporosis. You may have this done starting at age 16. Mammogram. This may be done every 1-2 years. Talk to your health care provider about how often you should have regular mammograms. Talk with your health care provider about your test results, treatment options, and if necessary, the need for more tests. Vaccines  Your health care provider may recommend certain vaccines, such as: Influenza vaccine. This is recommended every year. Tetanus, diphtheria, and acellular pertussis (Tdap, Td) vaccine. You may need a Td booster every 10 years. Zoster vaccine. You may need this after age 35. Pneumococcal 13-valent conjugate (PCV13) vaccine. One dose is recommended after age 78. Pneumococcal polysaccharide (PPSV23) vaccine. One dose is  recommended  after age 73. Talk to your health care provider about which screenings and vaccines you need and how often you need them. This information is not intended to replace advice given to you by your health care provider. Make sure you discuss any questions you have with your health care provider. Document Released: 07/03/2015 Document Revised: 02/24/2016 Document Reviewed: 04/07/2015 Elsevier Interactive Patient Education  2017 [place] Prevention in the Home Falls can cause injuries. They can happen to people of all ages. There are many things you can do to make your home safe and to help prevent falls. What can I do on the outside of my home? Regularly fix the edges of walkways and driveways and fix any cracks. Remove anything that might make you trip as you walk through a door, such as a raised step or threshold. Trim any bushes or trees on the path to your home. Use bright outdoor lighting. Clear any walking paths of anything that might make someone trip, such as rocks or tools. Regularly check to see if handrails are loose or broken. Make sure that both sides of any steps have handrails. Any raised decks and porches should have guardrails on the edges. Have any leaves, snow, or ice cleared regularly. Use sand or salt on walking paths during winter. Clean up any spills in your garage right away. This includes oil or grease spills. What can I do in the bathroom? Use night lights. Install grab bars by the toilet and in the tub and shower. Do not use towel bars as grab bars. Use non-skid mats or decals in the tub or shower. If you need to sit down in the shower, use a plastic, non-slip stool. Keep the floor dry. Clean up any water that spills on the floor as soon as it happens. Remove soap buildup in the tub or shower regularly. Attach bath mats securely with double-sided non-slip rug tape. Do not have throw rugs and other things on the floor that can make you  trip. What can I do in the bedroom? Use night lights. Make sure that you have a light by your bed that is easy to reach. Do not use any sheets or blankets that are too big for your bed. They should not hang down onto the floor. Have a firm chair that has side arms. You can use this for support while you get dressed. Do not have throw rugs and other things on the floor that can make you trip. What can I do in the kitchen? Clean up any spills right away. Avoid walking on wet floors. Keep items that you use a lot in easy-to-reach places. If you need to reach something above you, use a strong step stool that has a grab bar. Keep electrical cords out of the way. Do not use floor polish or wax that makes floors slippery. If you must use wax, use non-skid floor wax. Do not have throw rugs and other things on the floor that can make you trip. What can I do with my stairs? Do not leave any items on the stairs. Make sure that there are handrails on both sides of the stairs and use them. Fix handrails that are broken or loose. Make sure that handrails are as long as the stairways. Check any carpeting to make sure that it is firmly attached to the stairs. Fix any carpet that is loose or worn. Avoid having throw rugs at the top or bottom of the stairs. If you do  have throw rugs, attach them to the floor with carpet tape. Make sure that you have a light switch at the top of the stairs and the bottom of the stairs. If you do not have them, ask someone to add them for you. What else can I do to help prevent falls? Wear shoes that: Do not have high heels. Have rubber bottoms. Are comfortable and fit you well. Are closed at the toe. Do not wear sandals. If you use a stepladder: Make sure that it is fully opened. Do not climb a closed stepladder. Make sure that both sides of the stepladder are locked into place. Ask someone to hold it for you, if possible. Clearly mark and make sure that you can  see: Any grab bars or handrails. First and last steps. Where the edge of each step is. Use tools that help you move around (mobility aids) if they are needed. These include: Canes. Walkers. Scooters. Crutches. Turn on the lights when you go into a dark area. Replace any light bulbs as soon as they burn out. Set up your furniture so you have a clear path. Avoid moving your furniture around. If any of your floors are uneven, fix them. If there are any pets around you, be aware of where they are. Review your medicines with your doctor. Some medicines can make you feel dizzy. This can increase your chance of falling. Ask your doctor what other things that you can do to help prevent falls. This information is not intended to replace advice given to you by your health care provider. Make sure you discuss any questions you have with your health care provider. Document Released: 04/02/2009 Document Revised: 11/12/2015 Document Reviewed: 07/11/2014 Elsevier Interactive Patient Education  2017 [employer].

## 2021-12-17 NOTE — Progress Notes (Signed)
I connected with Alexandria [name] today by telephone and verified that I am speaking with the correct person using two identifiers. Location patient: home Location provider: work Persons participating in the virtual visit: Kaisy, Severino LPN.   I discussed the limitations, risks, security and privacy concerns of performing an evaluation and management service by telephone and the availability of in person appointments. I also discussed with the patient that there may be a patient responsible charge related to this service. The patient expressed understanding and verbally consented to this telephonic visit.    Interactive audio and video telecommunications were attempted between this provider and patient, however failed, due to patient having technical difficulties OR patient did not have access to video capability.  We continued and completed visit with audio only.     Vital signs may be patient reported or missing.  Subjective:   Alexandria [name] is a 76 y.o. female who presents for Medicare Annual (Subsequent) preventive examination.  Review of Systems     Cardiac Risk Factors include: advanced age (>62men, >66 women);dyslipidemia     Objective:    Today's Vitals   12/17/21 0846  Weight: 163 lb (73.9 kg)  Height: 5' 3.75" (1.619 m)   Body mass index is 28.2 kg/m.     12/17/2021    8:53 AM 04/29/2020   10:36 AM 04/25/2019   11:08 AM 04/23/2018   10:55 AM 02/01/2016   11:56 AM  Advanced Directives  Does Patient Have a Medical Advance Directive? Yes Yes No Yes Yes  Type of Estate agent of [place];Living will Healthcare Power of [place];Living will  Healthcare Power of [place];Living will   Does patient want to make changes to medical advance directive?  Yes (Inpatient - patient defers changing a medical advance directive at this time - Information given)  No - Patient declined   Copy of Healthcare Power of Attorney in Chart? Yes -  validated most recent copy scanned in chart (See row information) Yes - validated most recent copy scanned in chart (See row information)  No - copy requested No - copy requested  Would patient like information on creating a medical advance directive?   No - Patient declined      Current Medications (verified) Outpatient Encounter Medications as of 12/17/2021  Medication Sig   albuterol (PROVENTIL HFA;VENTOLIN HFA) 108 (90 Base) MCG/ACT inhaler Inhale 2 puffs into the lungs every 6 (six) hours as needed for wheezing or shortness of breath.   albuterol (PROVENTIL) (2.5 MG/3ML) 0.083% nebulizer solution Take 3 mLs (2.5 mg total) by nebulization every 6 (six) hours as needed for wheezing or shortness of breath.   albuterol (VENTOLIN HFA) 108 (90 Base) MCG/ACT inhaler Inhale 2 puffs into the lungs every 6 (six) hours as needed for wheezing or shortness of breath.   Biotin 5000 MCG CAPS Take by mouth.   calcium carbonate (OSCAL) 1500 (600 Ca) MG TABS tablet Take 600 mg of elemental calcium by mouth 2 (two) times daily with a meal.   cholecalciferol (VITAMIN D) 1000 units tablet Take 2,000 Units by mouth daily.   estradiol (CLIMARA - DOSED IN MG/24 HR) 0.1 mg/24hr patch 0.1 mg once a week.   fluticasone (FLONASE) 50 MCG/ACT nasal spray Place 2 sprays into both nostrils daily.   hydrocortisone 2.5 % cream Apply topically.   loratadine (CLARITIN) 10 MG tablet Take 10 mg by mouth daily.   pyridOXINE (VITAMIN B-6) 100 MG tablet Take 100 mg by mouth daily.  vitamin C (ASCORBIC ACID) 500 MG tablet Take 500 mg by mouth daily.   zolpidem (AMBIEN) 10 MG tablet Take 10 mg by mouth at bedtime as needed.   ipratropium (ATROVENT) 0.03 % nasal spray Place 2 sprays into both nostrils every 12 (twelve) hours. (Patient not taking: Reported on 12/17/2021)   methylPREDNISolone (MEDROL DOSEPAK) 4 MG TBPK tablet Use as directed (Patient not taking: Reported on 12/17/2021)   phenylephrine (SUDAFED PE) 10 MG TABS tablet Take  10 mg by mouth every 4 (four) hours as needed. (Patient not taking: Reported on 11/12/2021)   predniSONE (DELTASONE) 10 MG tablet 6 tablets day 1, 5 tablets day 2, 4 tablets day 3, 3 tablets day 4, 2 tablets day 5, 1 tablet day 6 (Patient not taking: Reported on 11/12/2021)   No facility-administered encounter medications on file as of 12/17/2021.    Allergies (verified) Sulfa antibiotics, Aspirin, and Tetanus toxoids   History: Past Medical History:  Diagnosis Date   Allergy    Asthma, mild intermittent 02/01/2016   Past Surgical History:  Procedure Laterality Date   ABDOMINAL HYSTERECTOMY     History reviewed. No pertinent family history. Social History   Socioeconomic History   Marital status: Single    Spouse name: Not on file   Number of children: Not on file   Years of education: Not on file   Highest education level: Not on file  Occupational History   Not on file  Tobacco Use   Smoking status: Never   Smokeless tobacco: Never  Vaping Use   Vaping Use: Never used  Substance and Sexual Activity   Alcohol use: Yes    Comment: rare   Drug use: No   Sexual activity: Not Currently  Other Topics Concern   Not on file  Social History Narrative   Not on file   Social Determinants of Health   Financial Resource Strain: Low Risk  (12/17/2021)   Overall Financial Resource Strain (CARDIA)    Difficulty of Paying Living Expenses: Not hard at all  Food Insecurity: No Food Insecurity (12/17/2021)   Hunger Vital Sign    Worried About Running Out of Food in the Last Year: Never true    Ran Out of Food in the Last Year: Never true  Transportation Needs: No Transportation Needs (12/17/2021)   PRAPARE - Administrator, Civil Service (Medical): No    Lack of Transportation (Non-Medical): No  Physical Activity: Sufficiently Active (12/17/2021)   Exercise Vital Sign    Days of Exercise per Week: 3 days    Minutes of Exercise per Session: 60 min  Stress: No Stress  Concern Present (12/17/2021)   [employer] of Occupational Health - Occupational Stress Questionnaire    Feeling of Stress : Not at all  Social Connections: Not on file    Tobacco Counseling Counseling given: Not Answered   Clinical Intake:  Pre-visit preparation completed: Yes  Pain : No/denies pain     Nutritional Status: BMI 25 -29 Overweight Nutritional Risks: None Diabetes: No  How often do you need to have someone help you when you read instructions, pamphlets, or other written materials from your doctor or pharmacy?: 1 - Never What is the last grade level you completed in school?: college  Diabetic? no  Interpreter Needed?: No  Information entered by :: NAllen LPN   Activities of Daily Living    12/17/2021    8:54 AM  In your present state of health, do you have  any difficulty performing the following activities:  Hearing? 0  Vision? 0  Difficulty concentrating or making decisions? 0  Walking or climbing stairs? 0  Dressing or bathing? 0  Doing errands, shopping? 0  Preparing Food and eating ? N  Using the Toilet? N  In the past six months, have you accidently leaked urine? N  Do you have problems with loss of bowel control? N  Managing your Medications? N  Managing your Finances? N  Housekeeping or managing your Housekeeping? N    Patient Care Team: [name], MD as PCP - General (Family Medicine)  Indicate any recent Medical Services you may have received from other than [facility] providers in the past year (date may be approximate).     Assessment:   This is a routine wellness examination for Alexandria [name].  Hearing/Vision screen Vision Screening - Comments:: Regular eye exams, [facility]  Dietary issues and exercise activities discussed: Current Exercise Habits: Home exercise routine, Type of exercise: Other - see comments (stationary bike), Time (Minutes): 60, Frequency (Times/Week): 3, Weekly Exercise (Minutes/Week): 180   Goals  Addressed             This Visit's Progress    Patient Stated       12/17/2021, wants to lose weight       Depression Screen    12/17/2021    8:53 AM 05/03/2021   11:05 AM 04/29/2020   10:37 AM 04/25/2019   10:41 AM 04/23/2018   10:24 AM 12/23/2013    2:52 PM  PHQ 2/9 Scores  PHQ - 2 Score 0 0 1 0 0 0  PHQ- 9 Score 1         Fall Risk    12/17/2021    8:53 AM 04/29/2020   10:36 AM 04/25/2019   10:39 AM 04/23/2018   10:23 AM 12/23/2013    2:52 PM  Fall Risk   Falls in the past year? 0 0 0 0 No  Number falls in past yr: 0      Injury with Fall? 0      Risk for fall due to : Medication side effect No Fall Risks     Follow up Falls evaluation completed;Education provided;Falls prevention discussed        FALL RISK PREVENTION PERTAINING TO THE HOME:  Any stairs in or around the home? Yes  If so, are there any without handrails? No  Home free of loose throw rugs in walkways, pet beds, electrical cords, etc? Yes  Adequate lighting in your home to reduce risk of falls? Yes   ASSISTIVE DEVICES UTILIZED TO PREVENT FALLS:  Life alert? No  Use of a cane, walker or w/c? No  Grab bars in the bathroom? Yes  Shower chair or bench in shower? No  Elevated toilet seat or a handicapped toilet? No   TIMED UP AND GO:  Was the test performed? No .      Cognitive Function:        12/17/2021    8:56 AM  6CIT Screen  What Year? 0 points  What month? 3 points  What time? 0 points  Count back from 20 0 points  Months in reverse 0 points  Repeat phrase 2 points  Total Score 5 points    Immunizations Immunization History  Administered Date(s) Administered   Influenza, High Dose Seasonal PF 04/06/2014, 03/30/2015, 03/19/2018, 03/23/2020, 04/27/2021   Influenza,inj,Quad PF,6+ Mos 03/15/2016   Influenza-Unspecified 03/15/2019   PFIZER(Purple Top)SARS-COV-2 Vaccination 07/09/2019,  07/29/2019, 04/29/2020   Pneumococcal Conjugate-13 02/01/2016   Pneumococcal Polysaccharide-23  04/23/2018   Zoster Recombinat (Shingrix) 03/02/2019, 05/06/2019    TDAP status: allergy  Flu Vaccine status: Up to date  Pneumococcal vaccine status: Up to date  Covid-19 vaccine status: Completed vaccines  Qualifies for Shingles Vaccine? Yes   Zostavax completed Yes   Shingrix Completed?: Yes  Screening Tests Health Maintenance  Topic Date Due   INFLUENZA VACCINE  01/18/2022   MAMMOGRAM  07/01/2022   DEXA SCAN  01/07/2024   Pneumonia Vaccine 55+ Years old  Completed   Hepatitis C Screening  Completed   Zoster Vaccines- Shingrix  Completed   HPV VACCINES  Aged Out   TETANUS/TDAP  Discontinued   COVID-19 Vaccine  Discontinued   Fecal DNA (Cologuard)  Discontinued    Health Maintenance  There are no preventive care reminders to display for this patient.  Colorectal cancer screening: Type of screening: Cologuard. Completed 04/05/2019. Repeat every 3 years  Mammogram status: Completed 07/01/2021. Repeat every year  Bone Density status: Completed 01/07/2019.   Lung Cancer Screening: (Low Dose CT Chest recommended if Age 49-80 years, 30 pack-year currently smoking OR have quit w/in 15years.) does not qualify.   Lung Cancer Screening Referral: no  Additional Screening:  Hepatitis C Screening: does qualify; Completed 02/01/2016  Vision Screening: Recommended annual ophthalmology exams for early detection of glaucoma and other disorders of the eye. Is the patient up to date with their annual eye exam?  Yes  Who is the provider or what is the name of the office in which the patient attends annual eye exams? [facility] If pt is not established with a provider, would they like to be referred to a provider to establish care? No .   Dental Screening: Recommended annual dental exams for proper oral hygiene  Community Resource Referral / Chronic Care Management: CRR required this visit?  No   CCM required this visit?  No      Plan:     I have personally reviewed  and noted the following in the patient's chart:   Medical and social history Use of alcohol, tobacco or illicit drugs  Current medications and supplements including opioid prescriptions.  Functional ability and status Nutritional status Physical activity Advanced directives List of other physicians Hospitalizations, surgeries, and ER visits in previous 12 months Vitals Screenings to include cognitive, depression, and falls Referrals and appointments  In addition, I have reviewed and discussed with patient certain preventive protocols, quality metrics, and best practice recommendations. A written personalized care plan for preventive services as well as general preventive health recommendations were provided to patient.     Kellie Simmering, LPN   [id number]   Nurse Notes: none  Due to this being a virtual visit, the after visit summary with patients personalized plan was offered to patient via mail or my-chart.  patient was mailed a copy of AVS.

## 2022-01-10 DIAGNOSIS — L821 Other seborrheic keratosis: Secondary | ICD-10-CM | POA: Diagnosis not present

## 2022-01-10 DIAGNOSIS — D692 Other nonthrombocytopenic purpura: Secondary | ICD-10-CM | POA: Diagnosis not present

## 2022-01-10 DIAGNOSIS — D1801 Hemangioma of skin and subcutaneous tissue: Secondary | ICD-10-CM | POA: Diagnosis not present

## 2022-01-10 DIAGNOSIS — L570 Actinic keratosis: Secondary | ICD-10-CM | POA: Diagnosis not present

## 2022-02-14 ENCOUNTER — Telehealth: Payer: Self-pay | Admitting: Family Medicine

## 2022-02-14 DIAGNOSIS — Z6828 Body mass index (BMI) 28.0-28.9, adult: Secondary | ICD-10-CM | POA: Diagnosis not present

## 2022-02-14 DIAGNOSIS — Z01419 Encounter for gynecological examination (general) (routine) without abnormal findings: Secondary | ICD-10-CM | POA: Diagnosis not present

## 2022-02-14 DIAGNOSIS — Z1272 Encounter for screening for malignant neoplasm of vagina: Secondary | ICD-10-CM | POA: Diagnosis not present

## 2022-02-14 NOTE — Telephone Encounter (Signed)
Pt came in and dropped off advanced directives. Sending back to [facility] for review.

## 2022-02-23 ENCOUNTER — Encounter: Payer: Self-pay | Admitting: Internal Medicine

## 2022-04-11 ENCOUNTER — Encounter: Payer: Self-pay | Admitting: Internal Medicine

## 2022-05-23 ENCOUNTER — Ambulatory Visit: Payer: Medicare Other | Admitting: Family Medicine

## 2022-06-02 ENCOUNTER — Ambulatory Visit (INDEPENDENT_AMBULATORY_CARE_PROVIDER_SITE_OTHER): Payer: Medicare Other | Admitting: Nurse Practitioner

## 2022-06-02 ENCOUNTER — Encounter: Payer: Self-pay | Admitting: Nurse Practitioner

## 2022-06-02 VITALS — BP 124/80 | HR 64 | Temp 98.1°F | Ht 64.0 in | Wt 162.8 lb

## 2022-06-02 DIAGNOSIS — E785 Hyperlipidemia, unspecified: Secondary | ICD-10-CM

## 2022-06-02 DIAGNOSIS — H6123 Impacted cerumen, bilateral: Secondary | ICD-10-CM

## 2022-06-02 DIAGNOSIS — L232 Allergic contact dermatitis due to cosmetics: Secondary | ICD-10-CM

## 2022-06-02 DIAGNOSIS — Z0000 Encounter for general adult medical examination without abnormal findings: Secondary | ICD-10-CM

## 2022-06-02 DIAGNOSIS — Z2804 Immunization not carried out because of patient allergy to vaccine or component: Secondary | ICD-10-CM

## 2022-06-02 DIAGNOSIS — Z79890 Hormone replacement therapy (postmenopausal): Secondary | ICD-10-CM

## 2022-06-02 DIAGNOSIS — Z1211 Encounter for screening for malignant neoplasm of colon: Secondary | ICD-10-CM

## 2022-06-02 MED ORDER — MOMETASONE FUROATE 0.1 % EX CREA: TOPICAL_CREAM | CUTANEOUS | 1 refills | Status: DC

## 2022-06-02 NOTE — Patient Instructions (Signed)
It was a pleasure to meet you today!   We can have you come back at your convenience to have your ears cleared out. We can't do this today or medicare will charge you for it.   I will be in touch with you about your labs when these results come back.

## 2022-06-02 NOTE — Progress Notes (Addendum)
Worthy Keeler, DNP, AGNP-c [place], [place] [address] Main Office [phone]  BP 124/80   Pulse 64   Temp 98.1 F (36.7 C)   Ht 5' 4"$  (1.626 m)   Wt 162 lb 12.8 oz (73.8 kg)   BMI 27.94 kg/m    Subjective:    Patient ID: Alexandria [name], female    DOB: 06/27/1945, 76 y.o.   MRN: [id number]  HPI: BAILEE MARTINE is a 76 y.o. female presenting on 06/02/2022 for comprehensive medical examination.   Current medical concerns include:none  She reports regular vision exams q1-5y: Yes  She reports regular dental exams q 49m  Yes  The patient eats a regular, healthy diet. She endorses exercise and/or activity of:  goes to the gym to workout routinely  She endorses the following: Marital Status: divorced Living situation: alone  She denies concerns with STI today, testing was not ordered  A comprehensive review of systems was negative.  Most Recent Depression Screen:     06/02/2022    9:44 AM 12/17/2021    8:53 AM 05/03/2021   11:05 AM 04/29/2020   10:37 AM 04/25/2019   10:41 AM  Depression screen PHQ 2/9  Decreased Interest 0 0 0 0 0  Down, Depressed, Hopeless 0 0 0 1 0  PHQ - 2 Score 0 0 0 1 0  Altered sleeping  1     Tired, decreased energy  0     Change in appetite  0     Feeling bad or failure about yourself   0     Trouble concentrating  0     Moving slowly or fidgety/restless  0     Suicidal thoughts  0     PHQ-9 Score  1     Difficult doing work/chores  Not difficult at all      Most Recent Anxiety Screen:      No data to display         Most Recent Fall Screen:    06/02/2022    9:43 AM 12/17/2021    8:53 AM 04/29/2020   10:36 AM 04/25/2019   10:39 AM 04/23/2018   10:23 AM  Fall Risk   Falls in the past year? 0 0 0 0 0  Number falls in past yr: 0 0     Injury with Fall? 0 0     Risk for fall due to : No Fall Risks Medication side effect No Fall Risks    Follow up Falls evaluation completed Falls  evaluation completed;Education provided;Falls prevention discussed       Past medical history, surgical history, medications, allergies, family history and social history reviewed with patient today and changes made to appropriate areas of the chart.  Past Medical History:  Past Medical History:  Diagnosis Date   Allergy    Asthma, mild intermittent 02/01/2016   Medications:  Current Outpatient Medications on File Prior to Visit  Medication Sig   Biotin 5000 MCG CAPS Take by mouth.   calcium carbonate (OSCAL) 1500 (600 Ca) MG TABS tablet Take 600 mg of elemental calcium by mouth 2 (two) times daily with a meal.   cholecalciferol (VITAMIN D) 1000 units tablet Take 2,000 Units by mouth daily.   estradiol (CLIMARA - DOSED IN MG/24 HR) 0.1 mg/24hr patch 0.1 mg once a week.   hydrocortisone 2.5 % cream Apply topically. (Patient not taking: Reported on 08/10/2022)   loratadine (CLARITIN) 10 MG tablet Take 10  mg by mouth daily.   pyridOXINE (VITAMIN B-6) 100 MG tablet Take 100 mg by mouth daily.   vitamin C (ASCORBIC ACID) 500 MG tablet Take 500 mg by mouth daily.   No current facility-administered medications on file prior to visit.   Surgical History:  Past Surgical History:  Procedure Laterality Date   ABDOMINAL HYSTERECTOMY     Allergies:  Allergies  Allergen Reactions   Sulfa Antibiotics    Aspirin Other (See Comments)    Tinnitus    Misc. Sulfonamide Containing Compounds Other (See Comments)   Tetanus Toxoid Other (See Comments)   Tetanus Toxoids    Family History:  History reviewed. No pertinent family history.     Objective:    BP 124/80   Pulse 64   Temp 98.1 F (36.7 C)   Ht 5' 4"$  (1.626 m)   Wt 162 lb 12.8 oz (73.8 kg)   BMI 27.94 kg/m   Wt Readings from Last 3 Encounters:  08/10/22 165 lb (74.8 kg)  07/11/22 162 lb (73.5 kg)  06/02/22 162 lb 12.8 oz (73.8 kg)    Physical Exam Vitals and nursing note reviewed.  Constitutional:      General: She is not in  acute distress.    Appearance: Normal appearance.  HENT:     Head: Normocephalic and atraumatic.     Right Ear: Hearing normal. There is impacted cerumen.     Left Ear: Hearing normal. There is impacted cerumen.     Nose: Nose normal.     Right Sinus: No maxillary sinus tenderness or frontal sinus tenderness.     Left Sinus: No maxillary sinus tenderness or frontal sinus tenderness.     Mouth/Throat:     Lips: Pink.     Mouth: Mucous membranes are moist.     Pharynx: Oropharynx is clear.  Eyes:     General: Lids are normal. Vision grossly intact.     Extraocular Movements: Extraocular movements intact.     Conjunctiva/sclera: Conjunctivae normal.     Pupils: Pupils are equal, round, and reactive to light.     Funduscopic exam:    Right eye: Red reflex present.        Left eye: Red reflex present.    Visual Fields: Right eye visual fields normal and left eye visual fields normal.  Neck:     Thyroid: No thyromegaly.     Vascular: No carotid bruit.  Cardiovascular:     Rate and Rhythm: Normal rate and regular rhythm.     Chest Wall: PMI is not displaced.     Pulses: Normal pulses.          Dorsalis pedis pulses are 2+ on the right side and 2+ on the left side.       Posterior tibial pulses are 2+ on the right side and 2+ on the left side.     Heart sounds: Normal heart sounds. No murmur heard. Pulmonary:     Effort: Pulmonary effort is normal. No respiratory distress.     Breath sounds: Normal breath sounds.  Abdominal:     General: Abdomen is flat. Bowel sounds are normal. There is no distension.     Palpations: Abdomen is soft. There is no hepatomegaly, splenomegaly or mass.     Tenderness: There is no abdominal tenderness. There is no right CVA tenderness, left CVA tenderness, guarding or rebound.  Musculoskeletal:        General: Normal range of motion.  Cervical back: Full passive range of motion without pain, normal range of motion and neck supple. No tenderness.      Right lower leg: No edema.     Left lower leg: No edema.  Feet:     Left foot:     Toenail Condition: Left toenails are normal.  Lymphadenopathy:     Cervical: No cervical adenopathy.     Upper Body:     Right upper body: No supraclavicular adenopathy.     Left upper body: No supraclavicular adenopathy.  Skin:    General: Skin is warm and dry.     Capillary Refill: Capillary refill takes less than 2 seconds.     Nails: There is no clubbing.  Neurological:     General: No focal deficit present.     Mental Status: She is alert and oriented to person, place, and time.     GCS: GCS eye subscore is 4. GCS verbal subscore is 5. GCS motor subscore is 6.     Sensory: Sensation is intact.     Motor: Motor function is intact.     Coordination: Coordination is intact.     Gait: Gait is intact.     Deep Tendon Reflexes: Reflexes are normal and symmetric.  Psychiatric:        Attention and Perception: Attention normal.        Mood and Affect: Mood normal.        Speech: Speech normal.        Behavior: Behavior normal. Behavior is cooperative.        Thought Content: Thought content normal.        Cognition and Memory: Cognition and memory normal.        Judgment: Judgment normal.     Results for orders placed or performed in visit on 06/02/22  Cologuard  Result Value Ref Range   COLOGUARD Negative Negative  CBC with Differential/Platelet  Result Value Ref Range   WBC 5.3 3.4 - 10.8 x10E3/uL   RBC 5.57 (H) 3.77 - 5.28 x10E6/uL   Hemoglobin 16.5 (H) 11.1 - 15.9 g/dL   Hematocrit 49.9 (H) 34.0 - 46.6 %   MCV 90 79 - 97 fL   MCH 29.6 26.6 - 33.0 pg   MCHC 33.1 31.5 - 35.7 g/dL   RDW 12.7 11.7 - 15.4 %   Platelets 256 150 - 450 x10E3/uL   Neutrophils 50 Not Estab. %   Lymphs 38 Not Estab. %   Monocytes 8 Not Estab. %   Eos 3 Not Estab. %   Basos 1 Not Estab. %   Neutrophils Absolute 2.7 1.4 - 7.0 x10E3/uL   Lymphocytes Absolute 2.0 0.7 - 3.1 x10E3/uL   Monocytes Absolute 0.5 0.1  - 0.9 x10E3/uL   EOS (ABSOLUTE) 0.1 0.0 - 0.4 x10E3/uL   Basophils Absolute 0.0 0.0 - 0.2 x10E3/uL   Immature Granulocytes 0 Not Estab. %   Immature Grans (Abs) 0.0 0.0 - 0.1 x10E3/uL  Comprehensive metabolic panel  Result Value Ref Range   Glucose 92 70 - 99 mg/dL   BUN 16 8 - 27 mg/dL   Creatinine, Ser 0.81 0.57 - 1.00 mg/dL   eGFR 75 >59 mL/min/1.73   BUN/Creatinine Ratio 20 12 - 28   Sodium 136 134 - 144 mmol/L   Potassium 4.8 3.5 - 5.2 mmol/L   Chloride 98 96 - 106 mmol/L   CO2 25 20 - 29 mmol/L   Calcium 9.7 8.7 - 10.3 mg/dL  Total Protein 6.4 6.0 - 8.5 g/dL   Albumin 4.2 3.8 - 4.8 g/dL   Globulin, Total 2.2 1.5 - 4.5 g/dL   Albumin/Globulin Ratio 1.9 1.2 - 2.2   Bilirubin Total 0.8 0.0 - 1.2 mg/dL   Alkaline Phosphatase 59 44 - 121 IU/L   AST 25 0 - 40 IU/L   ALT 16 0 - 32 IU/L  Lipid panel  Result Value Ref Range   Cholesterol, Total 216 (H) 100 - 199 mg/dL   Triglycerides 66 0 - 149 mg/dL   HDL 49 >39 mg/dL   VLDL Cholesterol Cal 12 5 - 40 mg/dL   LDL Chol Calc (NIH) 155 (H) 0 - 99 mg/dL   Chol/HDL Ratio 4.4 0.0 - 4.4 ratio    IMMUNIZATIONS:   Flu: Flu vaccine completed elsewhere this season Prevnar 13: Prevnar 13 completed, documentation in chart Pneumovax 23: Pneumovax completed, documentation in chart Vac Shingrix: Shingrix completed, documentation in chart (Dose # 2/2) HPV: HPV N/A for this patient Tetanus: Tetanus contraindicated  HEALTH MAINTENANCE: Pap Smear HM Status: is not applicable for this patient Mammogram HM Status: is up to date Colon Cancer Screening HM Status: is up to date Bone Density HM Status: is up to date STI Testing HM Status: is not applicable for this patient  Eye Exam HM Status: is up to date Urine Micro HM Status: is not applicable for this patient  Spirometry HM Status: is not applicable for this patient      Assessment & Plan:   Problem List Items Addressed This Visit     Borderline hyperlipidemia    Monitor lipids  today      Relevant Orders   Lipid panel (Completed)   Hormone replacement therapy (HRT)    Monitor labs today.       Encounter for annual physical exam - Primary    CPE today with no abnormalities noted on exam.  Labs pending. Will make changes as necessary based on results.  Review of HM activities and recommendations discussed and provided on AVS Anticipatory guidance, diet, and exercise recommendations provided.  Medications, allergies, and hx reviewed and updated as necessary.  Plan to f/u with CPE in 1 year or sooner for acute/chronic health needs as directed.        Relevant Orders   CBC with Differential/Platelet (Completed)   Comprehensive metabolic panel (Completed)   Lipid panel (Completed)   Hearing loss of both ears due to cerumen impaction    Bilateral impaction present. Recommend return for ear cleaning at convenience. No concerning symptoms present at this time.       Other Visit Diagnoses     Missed vaccination due to allergy to vaccine or component       Allergic contact dermatitis due to cosmetics       Relevant Medications   mometasone (ELOCON) 0.1 % cream   Screening for colon cancer       Relevant Orders   Cologuard (Completed)   Health care maintenance       Relevant Orders   CBC with Differential/Platelet (Completed)   Comprehensive metabolic panel (Completed)   Lipid panel (Completed)          Follow up plan: Return for Ears cleaned at convenience.  NEXT PREVENTATIVE PHYSICAL DUE IN 1 YEAR.  PATIENT COUNSELING PROVIDED FOR ALL ADULT PATIENTS:  Consume a well balanced diet low in saturated fats, cholesterol, and moderation in carbohydrates.   This can be as simple as monitoring portion sizes  and cutting back on sugary beverages such as soda and juice to start with.    Daily water consumption of at least 64 ounces.  Physical activity at least 180 minutes per week, if just starting out.   This can be as simple as taking the stairs  instead of the elevator and walking 2-3 laps around the office  purposefully every day.   STD protection, partner selection, and regular testing if high risk.  Limited consumption of alcoholic beverages if alcohol is consumed.  For women, I recommend no more than 7 alcoholic beverages per week, spread out throughout the week.  Avoid "binge" drinking or consuming large quantities of alcohol in one setting.   Please let me know if you feel you may need help with reduction or quitting alcohol consumption.   Avoidance of nicotine, if used.  Please let me know if you feel you may need help with reduction or quitting nicotine use.   Daily mental health attention.  This can be in the form of 5 minute daily meditation, prayer, journaling, yoga, reflection, etc.   Purposeful attention to your emotions and mental state can significantly improve your overall wellbeing and Health.  Please know that I am here to help you with all of your health care goals and am happy to work with you to find a solution that works best for you.  The greatest advice I have received with any changes in life are to take it one step at a time, that even means if all you can focus on is the next 60 seconds, then do that and celebrate your victories.  With any changes in life, you will have set backs, and that is OK. The important thing to remember is, if you have a set back, it is not a failure, it is an opportunity to try again!  Health Maintenance Recommendations Screening Testing Mammogram Every 1 -2 years based on history and risk factors Starting at age 29 Pap Smear Ages 21-39 every 3 years Ages 71-65 every 5 years with HPV testing More frequent testing may be required based on results and history Colon Cancer Screening Every 1-10 years based on test performed, risk factors, and history Starting at age 68 Bone Density Screening Every 2-10 years based on history Starting at age 73 for women Recommendations for  men differ based on medication usage, history, and risk factors AAA Screening One time ultrasound Men 35-33 years old who have every smoked Lung Cancer Screening Low Dose Lung CT every 12 months Age 7-80 years with a 30 pack-year smoking history who still smoke or who have quit within the last 15 years  Screening Labs Routine  Labs: Complete Blood Count (CBC), Complete Metabolic Panel (CMP), Cholesterol (Lipid Panel) Every 6-12 months based on history and medications May be recommended more frequently based on current conditions or previous results Hemoglobin A1c Lab Every 3-12 months based on history and previous results Starting at age 60 or earlier with diagnosis of diabetes, high cholesterol, BMI >26, and/or risk factors Frequent monitoring for patients with diabetes to ensure blood sugar control Thyroid Panel (TSH w/ T3 & T4) Every 6 months based on history, symptoms, and risk factors May be repeated more often if on medication HIV One time testing for all patients 3 and older May be repeated more frequently for patients with increased risk factors or exposure Hepatitis C One time testing for all patients 73 and older May be repeated more frequently for patients with increased risk  factors or exposure Gonorrhea, Chlamydia Every 12 months for all sexually active persons 13-24 years Additional monitoring may be recommended for those who are considered high risk or who have symptoms PSA Men 5-59 years old with risk factors Additional screening may be recommended from age 31-69 based on risk factors, symptoms, and history  Vaccine Recommendations Tetanus Booster All adults every 10 years Flu Vaccine All patients 6 months and older every year COVID Vaccine All patients 12 years and older Initial dosing with booster May recommend additional booster based on age and health history HPV Vaccine 2 doses all patients age 43-26 Dosing may be considered for patients over  26 Shingles Vaccine (Shingrix) 2 doses all adults 34 years and older Pneumonia (Pneumovax 23) All adults 17 years and older May recommend earlier dosing based on health history Pneumonia (Prevnar 51) All adults 43 years and older Dosed 1 year after Pneumovax 23  Additional Screening, Testing, and Vaccinations may be recommended on an individualized basis based on family history, health history, risk factors, and/or exposure.

## 2022-06-03 LAB — COMPREHENSIVE METABOLIC PANEL
ALT: 16 IU/L (ref 0–32)
AST: 25 IU/L (ref 0–40)
Albumin/Globulin Ratio: 1.9 (ref 1.2–2.2)
Albumin: 4.2 g/dL (ref 3.8–4.8)
Alkaline Phosphatase: 59 IU/L (ref 44–121)
BUN/Creatinine Ratio: 20 (ref 12–28)
BUN: 16 mg/dL (ref 8–27)
Bilirubin Total: 0.8 mg/dL (ref 0.0–1.2)
CO2: 25 mmol/L (ref 20–29)
Calcium: 9.7 mg/dL (ref 8.7–10.3)
Chloride: 98 mmol/L (ref 96–106)
Creatinine, Ser: 0.81 mg/dL (ref 0.57–1.00)
Globulin, Total: 2.2 g/dL (ref 1.5–4.5)
Glucose: 92 mg/dL (ref 70–99)
Potassium: 4.8 mmol/L (ref 3.5–5.2)
Sodium: 136 mmol/L (ref 134–144)
Total Protein: 6.4 g/dL (ref 6.0–8.5)
eGFR: 75 mL/min/{1.73_m2} (ref >59)

## 2022-06-03 LAB — CBC WITH DIFFERENTIAL/PLATELET
Basophils Absolute: 0 10*3/uL (ref 0.0–0.2)
Basos: 1 %
EOS (ABSOLUTE): 0.1 10*3/uL (ref 0.0–0.4)
Eos: 3 %
Hematocrit: 49.9 % — ABNORMAL HIGH (ref 34.0–46.6)
Hemoglobin: 16.5 g/dL — ABNORMAL HIGH (ref 11.1–15.9)
Immature Grans (Abs): 0 10*3/uL (ref 0.0–0.1)
Immature Granulocytes: 0 %
Lymphocytes Absolute: 2 10*3/uL (ref 0.7–3.1)
Lymphs: 38 %
MCH: 29.6 pg (ref 26.6–33.0)
MCHC: 33.1 g/dL (ref 31.5–35.7)
MCV: 90 fL (ref 79–97)
Monocytes Absolute: 0.5 10*3/uL (ref 0.1–0.9)
Monocytes: 8 %
Neutrophils Absolute: 2.7 10*3/uL (ref 1.4–7.0)
Neutrophils: 50 %
Platelets: 256 10*3/uL (ref 150–450)
RBC: 5.57 x10E6/uL — ABNORMAL HIGH (ref 3.77–5.28)
RDW: 12.7 % (ref 11.7–15.4)
WBC: 5.3 10*3/uL (ref 3.4–10.8)

## 2022-06-03 LAB — LIPID PANEL
Chol/HDL Ratio: 4.4 ratio (ref 0.0–4.4)
Cholesterol, Total: 216 mg/dL — ABNORMAL HIGH (ref 100–199)
HDL: 49 mg/dL (ref >39)
LDL Chol Calc (NIH): 155 mg/dL — ABNORMAL HIGH (ref 0–99)
Triglycerides: 66 mg/dL (ref 0–149)
VLDL Cholesterol Cal: 12 mg/dL (ref 5–40)

## 2022-06-19 DIAGNOSIS — Z0000 Encounter for general adult medical examination without abnormal findings: Secondary | ICD-10-CM | POA: Insufficient documentation

## 2022-06-19 DIAGNOSIS — H6123 Impacted cerumen, bilateral: Secondary | ICD-10-CM | POA: Insufficient documentation

## 2022-06-19 NOTE — Assessment & Plan Note (Signed)
Monitor lipids today

## 2022-06-19 NOTE — Assessment & Plan Note (Signed)
Bilateral impaction present. Recommend return for ear cleaning at convenience. No concerning symptoms present at this time.

## 2022-06-19 NOTE — Assessment & Plan Note (Signed)

## 2022-06-19 NOTE — Assessment & Plan Note (Signed)
Monitor labs today.

## 2022-06-21 DIAGNOSIS — Z1211 Encounter for screening for malignant neoplasm of colon: Secondary | ICD-10-CM | POA: Diagnosis not present

## 2022-06-25 LAB — COLOGUARD: COLOGUARD: NEGATIVE

## 2022-07-11 ENCOUNTER — Encounter: Payer: Self-pay | Admitting: Nurse Practitioner

## 2022-07-11 ENCOUNTER — Telehealth (INDEPENDENT_AMBULATORY_CARE_PROVIDER_SITE_OTHER): Payer: Medicare Other | Admitting: Nurse Practitioner

## 2022-07-11 ENCOUNTER — Telehealth: Payer: Self-pay | Admitting: Nurse Practitioner

## 2022-07-11 VITALS — Temp 97.1°F | Wt 162.0 lb

## 2022-07-11 DIAGNOSIS — J40 Bronchitis, not specified as acute or chronic: Secondary | ICD-10-CM | POA: Insufficient documentation

## 2022-07-11 MED ORDER — PROMETHAZINE-DM 6.25-15 MG/5ML PO SYRP: 5.0000 mL | ORAL_SOLUTION | Freq: Four times a day (QID) | ORAL | 0 refills | Status: DC | PRN

## 2022-07-11 MED ORDER — HYDROCODONE BIT-HOMATROP MBR 5-1.5 MG/5ML PO SOLN: 5.0000 mL | Freq: Three times a day (TID) | ORAL | 0 refills | Status: DC | PRN

## 2022-07-11 MED ORDER — PREDNISONE 10 MG PO TABS: 10.0000 mg | ORAL_TABLET | Freq: Every day | ORAL | 0 refills | Status: DC

## 2022-07-11 MED ORDER — AZITHROMYCIN 250 MG PO TABS: ORAL_TABLET | ORAL | 0 refills | Status: AC

## 2022-07-11 NOTE — Telephone Encounter (Signed)
Pt called back again and states that the [facility] [address] does not have enough of the cough medicine to fill her rx so she will need something else. Please send to [facility] on [address]. PT can be reached at [phone].

## 2022-07-11 NOTE — Telephone Encounter (Signed)
Medication changed and new script sent to [address] [facility]

## 2022-07-11 NOTE — Assessment & Plan Note (Addendum)
Symptoms consistent with bronchitis. Given her symptoms and history. we will plan to go ahead and start prednisone burst and azithromycin. I initially sent in hycodan at patient request, however, the pharmacy is out. We will try promethazine DM to see if this is helpful for her cough. She will follow-up if there is no improvement in the next 5 days.

## 2022-07-11 NOTE — Progress Notes (Signed)
Virtual Visit Encounter mychart visit.   I connected with  Alexandria [name] on 07/11/22 at  3:00 PM EST by secure video and audio telemedicine application. I verified that I am speaking with the correct person using two identifiers.   I introduced myself as a Designer, jewellery with the practice. The limitations of evaluation and management by telemedicine discussed with the patient and the availability of in person appointments. The patient expressed verbal understanding and consent to proceed.  Participating parties in this visit include: Myself and patient  The patient is: Patient Location: Home I am: Provider Location: Office/Clinic Subjective:    CC and HPI: Alexandria [name] is a 77 y.o. year old female presenting for new evaluation and treatment of cough. Patient reports the following: Cough Alexandria [name] tells me she gets a cough about twice a year due to allergies. She reports that the coughing burns her chest.She started coughing at 10pm Friday night and coughed all through the night. She also developed congestion in the face. She is not running a fever, no body aches, no chills. She is not sleeping very well due to the cough. She did wake up the Sunday before last and her heat had gone out and her house was 62 degrees, she isn't sure if this could have triggered the onset of something or not.   Past medical history, Surgical history, Family history not pertinant except as noted below, Social history, Allergies, and medications have been entered into the medical record, reviewed, and corrections made.   Review of Systems:  All review of systems negative except what is listed in the HPI  Objective:    Alert and oriented x 4 Cough and congestion present.  Speaking in clear sentences with no shortness of breath. No distress.  Impression and Recommendations:    Problem List Items Addressed This Visit     Bronchitis - Primary    Symptoms consistent with bronchitis. Given her  symptoms and history. we will plan to go ahead and start prednisone burst and azithromycin. I initially sent in hycodan at patient request, however, the pharmacy is out. We will try promethazine DM to see if this is helpful for her cough. She will follow-up if there is no improvement in the next 5 days.      Relevant Medications   azithromycin (ZITHROMAX) 250 MG tablet   predniSONE (DELTASONE) 10 MG tablet   promethazine-dextromethorphan (PROMETHAZINE-DM) 6.25-15 MG/5ML syrup    orders and follow up as documented in EMR I discussed the assessment and treatment plan with the patient. The patient was provided an opportunity to ask questions and all were answered. The patient agreed with the plan and demonstrated an understanding of the instructions.   The patient was advised to call back or seek an in-person evaluation if the symptoms worsen or if the condition fails to improve as anticipated.  Follow-Up: prn  I provided 23 minutes of non-face-to-face interaction with this non face-to-face encounter including intake, same-day documentation, and chart review.   Orma Render, NP , DNP, AGNP-c [facility]

## 2022-07-11 NOTE — Telephone Encounter (Signed)
[facility] and said the [facility]an was out of stock I also [facility] the other [facility] [address] they are out of stock, Please inform next step

## 2022-07-11 NOTE — Telephone Encounter (Signed)
Pt called back and said that If something is called in different she wants [facility]  [address]

## 2022-08-09 LAB — HM MAMMOGRAPHY

## 2022-08-10 ENCOUNTER — Ambulatory Visit (INDEPENDENT_AMBULATORY_CARE_PROVIDER_SITE_OTHER): Payer: Medicare Other | Admitting: Family Medicine

## 2022-08-10 ENCOUNTER — Encounter: Payer: Self-pay | Admitting: Family Medicine

## 2022-08-10 VITALS — BP 130/80 | HR 64 | Ht 64.0 in | Wt 165.0 lb

## 2022-08-10 DIAGNOSIS — I499 Cardiac arrhythmia, unspecified: Secondary | ICD-10-CM | POA: Diagnosis not present

## 2022-08-10 DIAGNOSIS — I4891 Unspecified atrial fibrillation: Secondary | ICD-10-CM

## 2022-08-10 DIAGNOSIS — H6123 Impacted cerumen, bilateral: Secondary | ICD-10-CM | POA: Diagnosis not present

## 2022-08-10 DIAGNOSIS — R000 Tachycardia, unspecified: Secondary | ICD-10-CM

## 2022-08-10 NOTE — Progress Notes (Signed)
Chief Complaint  Patient presents with   Cerumen Impaction    Wax in ears, left worse than right.    Her ears have been stopped up since before her December physical.  Wax was noted at her physical, but she hadn't been able to get back to have the wax removed.  She denies any ear pain. She reports always needing her ears cleaned once a year. She doesn't use Q-tips.  She has some mild allergies (occasional sneeze/cough), but is finally over a recent illness. She saw SaraBeth a month ago for URI.  Slight residual congestion, but much better.   PMH, PSH, SH reviewed  Outpatient Encounter Medications as of 08/10/2022  Medication Sig Note   Biotin 5000 MCG CAPS Take by mouth.    calcium carbonate (OSCAL) 1500 (600 Ca) MG TABS tablet Take 600 mg of elemental calcium by mouth 2 (two) times daily with a meal.    cholecalciferol (VITAMIN D) 1000 units tablet Take 2,000 Units by mouth daily.    estradiol (CLIMARA - DOSED IN MG/24 HR) 0.1 mg/24hr patch 0.1 mg once a week.    loratadine (CLARITIN) 10 MG tablet Take 10 mg by mouth daily.    pyridOXINE (VITAMIN B-6) 100 MG tablet Take 100 mg by mouth daily.    vitamin C (ASCORBIC ACID) 500 MG tablet Take 500 mg by mouth daily.    hydrocortisone 2.5 % cream Apply topically. (Patient not taking: Reported on 08/10/2022) 08/10/2022: As needed   mometasone (ELOCON) 0.1 % cream Apply to the area of rash in a very thin layer in the morning and at bedtime for no more than 7 days in a row. If needed for longer you may restart after 3 day break. (Patient not taking: Reported on 08/10/2022)    [DISCONTINUED] predniSONE (DELTASONE) 10 MG tablet Take 1 tablet (10 mg total) by mouth daily with breakfast.    [DISCONTINUED] promethazine-dextromethorphan (PROMETHAZINE-DM) 6.25-15 MG/5ML syrup Take 5 mLs by mouth 4 (four) times daily as needed for cough.    No facility-administered encounter medications on file as of 08/10/2022.   Allergies  Allergen Reactions   Sulfa  Antibiotics    Aspirin Other (See Comments)    Tinnitus    Misc. Sulfonamide Containing Compounds Other (See Comments)   Tetanus Toxoid Other (See Comments)   Tetanus Toxoids     ROS: Denies fevers, shortness of breath, chest pain, DOE. No palpitations or tachycardia that she is aware of.  No headaches, dizziness.  Mild sneezing/coughing since recent URI. Occ tinnitus.   PHYSICAL EXAM:  BP 130/80   Pulse 64   Ht 5' 4"$  (1.626 m)   Wt 165 lb (74.8 kg)   BMI 28.32 kg/m   Talkative, pleasant female in good spirits HEENT: conjunctiva and sclera are clear, EOMI.  R--occluded with cerumen, only very small area inferiorly that might be clear.  L--completely occluded, with lots of cerumen.  After ear lavage by nurse, TM's and EACs completely clear, and normal. OP clear. Nose--no drainage, no sinus tenderness Neck: no lymphadenopathy or mass Heart: irregularly irregular, no murmur Lungs: clear bilaterally Extremities: no edema  EKG:  atrial fib with RVR   ASSESSMENT/PLAN:  Bilateral impacted cerumen - s/p bilateral ear lavage with good results.  Patient tolerated well  Atrial fibrillation with RVR ([place]) - unclear duration/onset, patient completely asymptomatic, incidental finding while here for ear lavage.  Irregularly irregular pulse rhythm - EKG with RVR noted. Patient entirely asymptomatic.  check TSH.  Discussed afib and  stroke risks briefly. She would like to d/w Dr. Redmond [employer] further - Plan: EKG 12-Lead, TSH  Tachycardia - asymptomatic - Plan: TSH   Last CPE reviewed, with SaraBeth.  No physical exam documentation was found, but she doesn't recall any abnormalities (d/w her). 05/2022 labs reviewed--cbc, c-met, lipids. LDL 155, ratio 4.4, Hgb 16.5, rest of labs normal, normal c-met. TSH being checked today.  Await TSH results, then advise pt, and refer to afib clinic (vs working her in with J[facility] early next week if she declines referral). Start aspirin daily. Since this was  an incidental finding, and she was truly asymptomatic, didn't start beta blocker for rate control at this time.

## 2022-08-11 LAB — TSH: TSH: 1.76 u[IU]/mL (ref 0.450–4.500)

## 2022-08-11 NOTE — Progress Notes (Signed)
Spoke with patient and she would prefer to see JCl, scheduled with him Monday 08/15/22 @ 1:30. She would not take the aspirin in the meantime unfortunately, she states that it makes her ears ring.

## 2022-08-15 ENCOUNTER — Ambulatory Visit (INDEPENDENT_AMBULATORY_CARE_PROVIDER_SITE_OTHER): Payer: Medicare Other | Admitting: Family Medicine

## 2022-08-15 ENCOUNTER — Encounter: Payer: Self-pay | Admitting: Family Medicine

## 2022-08-15 VITALS — BP 138/88 | HR 69 | Temp 97.9°F | Resp 16 | Wt 166.4 lb

## 2022-08-15 DIAGNOSIS — I4891 Unspecified atrial fibrillation: Secondary | ICD-10-CM | POA: Diagnosis not present

## 2022-08-15 DIAGNOSIS — I7090 Unspecified atherosclerosis: Secondary | ICD-10-CM

## 2022-08-15 MED ORDER — METOPROLOL SUCCINATE ER 50 MG PO TB24: 50.0000 mg | ORAL_TABLET | Freq: Every day | ORAL | 3 refills | Status: DC

## 2022-08-15 MED ORDER — RIVAROXABAN 20 MG PO TABS: 20.0000 mg | ORAL_TABLET | Freq: Every day | ORAL | 0 refills | Status: DC

## 2022-08-15 NOTE — Progress Notes (Signed)
   Subjective:    Patient ID: Alexandria [name], female    DOB: 06-20-1946, 77 y.o.   MRN: [id number]  HPI She is here for consult concern and recent diagnosis of atrial fib.  This was found during a routine exam for URI.  She held off on referral to cardiology until she had a chance to talk to me.  Presently she is having no chest pain, shortness of breath, symptoms of irregular heart rate.  She also had a recent mammogram which showed arterial calcifications.   Review of Systems     Objective:   Physical Exam Alert and in no distress.  Heart rate is irregular and above 100.  Lungs are clear to auscultation.       Assessment & Plan:  Atrial fibrillation with RVR ([place]) - Plan: Ambulatory referral to Cardiology, rivaroxaban (XARELTO) 20 MG TABS tablet, metoprolol succinate (TOPROL-XL) 50 MG 24 hr tablet  Arterial calcification I explained that there is no way to know how long she has had difficulty with the irregular heart rate since she is asymptomatic.  I will place her on Xarelto for protection and also started on low-dose Toprol to slow her heart rate down.  Referral will be made to cardiology.  Discussed the fact that with the arterial calcifications we will probably also put her on a statin drug but will defer that until after being seen by cardiology.

## 2022-08-17 DIAGNOSIS — K080 Exfoliation of teeth due to systemic causes: Secondary | ICD-10-CM | POA: Diagnosis not present

## 2022-08-19 NOTE — Progress Notes (Unsigned)
Cardiology Office Note:    Date:  08/22/2022   ID:  Alexandria [name], DOB 03/02/1946, MRN [id number]  PCP:  Alexandria Lung, MD   [facility] Prov[facility] Cardiologist:  [name], MD Referring MD: Alexandria Lung, MD   Chief Complaint/Reason for Referral: Atrial fibrillation  ASSESSMENT:    1. PAF (paroxysmal atrial fibrillation) ([place])   2. Atrial fibrillation with RVR (H[facility])     PLAN:    In order of problems listed above: 1.  Paroxysmal atrial fibrillation: Agree with Toprol and Xarelto.  Will increase Toprol to 75 mg or improve rate control and will obtain an echocardiogram.  Will refer to atrial fibrillation clinic.  At this time I do not see a compelling indication to try to obtain normal sinus rhythm but this may change if her ejection fraction is abnormal.  We will postpone her echocardiogram for few weeks after the augmented dose of Toprol has been initiated so we can obtain better rate control and perhaps some more precise assessment of her ejection fraction.             Dispo:  No follow-ups on file.      Medication Adjustments/Labs and Tests Ordered: Current medicines are reviewed at length with the patient today.  Concerns regarding medicines are outlined above.  The following changes have been made:     Labs/tests ordered: Orders Placed This Encounter  Procedures   Amb Referral to AFIB Clinic   ECHOCARDIOGRAM COMPLETE    Medication Changes: Meds ordered this encounter  Medications   rivaroxaban (XARELTO) 20 MG TABS tablet    Sig: Take 1 tablet (20 mg total) by mouth daily with supper.    Dispense:  90 tablet    Refill:  1   metoprolol succinate (TOPROL XL) 50 MG 24 hr tablet    Sig: Take 1.5 tablets (75 mg total) by mouth daily. Take with or immediately following a meal.    Dispense:  135 tablet    Refill:  3    Dose increase     Current medicines are reviewed at length with the patient today.  The patient does not have concerns regarding  medicines.   History of Present Illness:    FOCUSED PROBLEM LIST:   1.  Atrial fibrillation with CV 2 score of 3  The patient is a 77 y.o. female with the indicated medical history here for recommendations regarding incidentally noted atrial fibrillation.  The patient was seen by her primary care provider recently for any ear issue.  She was noted to be an irregular rhythm and EKG was done which demonstrated atrial fibrillation with rapid ventricular rate.  She was completely asymptomatic.  She was started on Toprol XL 50 mg as well as Xarelto.  A TSH was drawn which was normal.  She is referred for further recommendations.  She is here today without significant cardiovascular complaints.  She denies any palpitations, paroxysmal nocturnal dyspnea, orthopnea.  She is suffering from allergies and she takes loratadine on a regular basis.  She denies any severe bleeding or bruising while on Xarelto.  She denies any chest pain.  She is asymptomatic from a cardiovascular point of view today.          Current Medications: Current Meds  Medication Sig   Biotin 5000 MCG CAPS Take by mouth.   calcium carbonate (OSCAL) 1500 (600 Ca) MG TABS tablet Take 600 mg of elemental calcium by mouth 2 (two) times daily with a meal.  cholecalciferol (VITAMIN D) 1000 units tablet Take 2,000 Units by mouth daily.   estradiol (CLIMARA - DOSED IN MG/24 HR) 0.1 mg/24hr patch 0.1 mg once a week.   hydrocortisone 2.5 % cream Apply topically.   loratadine (CLARITIN) 10 MG tablet Take 10 mg by mouth daily.   metoprolol succinate (TOPROL XL) 50 MG 24 hr tablet Take 1.5 tablets (75 mg total) by mouth daily. Take with or immediately following a meal.   mometasone (ELOCON) 0.1 % cream Apply to the area of rash in a very thin layer in the morning and at bedtime for no more than 7 days in a row. If needed for longer you may restart after 3 day break.   pyridOXINE (VITAMIN B-6) 100 MG tablet Take 100 mg by mouth daily.    vitamin C (ASCORBIC ACID) 500 MG tablet Take 500 mg by mouth daily.   [DISCONTINUED] metoprolol succinate (TOPROL-XL) 50 MG 24 hr tablet Take 1 tablet (50 mg total) by mouth daily. Take with or immediately following a meal.   [DISCONTINUED] rivaroxaban (XARELTO) 20 MG TABS tablet Take 1 tablet (20 mg total) by mouth daily with supper.     Allergies:    Sulfa antibiotics, Aspirin, Misc. sulfonamide containing compounds, Tetanus toxoid, and Tetanus toxoids   Social History:   Social History   Tobacco Use   Smoking status: Never   Smokeless tobacco: Never  Vaping Use   Vaping Use: Never used  Substance Use Topics   Alcohol use: Yes    Comment: rare   Drug use: No     Family Hx: History reviewed. No pertinent family history.   Review of Systems:   Please see the history of present illness.    All other systems reviewed and are negative.     EKGs/Labs/Other Test Reviewed:    EKG:  EKG performed February 2024 that I personally reviewed demonstrates atrial fibrillation with rapid ventricular response.  Prior CV studies: None available     Other studies Reviewed: Review of the additional studies/records demonstrates: Imaging studies reviewed demonstrate no evidence of aortic atherosclerosis or coronary artery calcification  Recent Labs: 06/02/2022: ALT 16; BUN 16; Creatinine, Ser 0.81; Hemoglobin 16.5; Platelets 256; Potassium 4.8; Sodium 136 08/10/2022: TSH 1.760   Recent Lipid Panel Lab Results  Component Value Date/Time   CHOL 216 (H) 06/02/2022 10:30 AM   TRIG 66 06/02/2022 10:30 AM   HDL 49 06/02/2022 10:30 AM   LDLCALC 155 (H) 06/02/2022 10:30 AM    Risk Assessment/Calculations:     CHA2DS2-VASc Score = 3   This indicates a 3.2% annual risk of stroke. The patient's score is based upon: CHF History: 0 HTN History: 0 Diabetes History: 0 Stroke History: 0 Vascular Disease History: 0 Age Score: 2 Gender Score: 1         HYPERTENSION CONTROL Vitals:    08/22/22 1501 08/22/22 1516  BP: (!) 142/80 (!) 140/80    The patient's blood pressure is elevated above target today.  In order to address the patient's elevated BP: A current anti-hypertensive medication was adjusted today.       Physical Exam:    VS:  BP (!) 140/80   Pulse (!) 105   Ht '5\' 4"'$  (1.626 m)   Wt 161 lb (73 kg)   SpO2 98%   BMI 27.64 kg/m    Wt Readings from Last 3 Encounters:  08/22/22 161 lb (73 kg)  08/15/22 166 lb 6.4 oz (75.5 kg)  08/10/22 165 lb (  74.8 kg)    GENERAL:  No apparent distress, AOx3 HEENT:  No carotid bruits, +2 carotid impulses, no scleral icterus CAR: RRR Irregular no murmurs, gallops, rubs, or thrills RES:  Clear to auscultation bilaterally ABD:  Soft, nontender, nondistended, positive bowel sounds x 4 VASC:  +2 radial pulses, +2 carotid pulses, palpable pedal pulses NEURO:  CN 2-12 grossly intact; motor and sensory grossly intact PSYCH:  No active depression or anxiety EXT:  No edema, ecchymosis, or cyanosis  Signed, Early Osmond, MD  08/22/2022 3:46 PM    [address], [place], [place]  [address] Phone: [phone]; Fax: [phone]   Note:  This document was prepared using Dragon voice recognition software and may include unintentional dictation errors.

## 2022-08-22 ENCOUNTER — Encounter: Payer: Self-pay | Admitting: Internal Medicine

## 2022-08-22 ENCOUNTER — Ambulatory Visit: Payer: Medicare Other | Attending: Internal Medicine | Admitting: Internal Medicine

## 2022-08-22 VITALS — BP 140/80 | HR 105 | Ht 64.0 in | Wt 161.0 lb

## 2022-08-22 DIAGNOSIS — I4891 Unspecified atrial fibrillation: Secondary | ICD-10-CM

## 2022-08-22 DIAGNOSIS — I480 Paroxysmal atrial fibrillation: Secondary | ICD-10-CM | POA: Diagnosis not present

## 2022-08-22 MED ORDER — METOPROLOL SUCCINATE ER 50 MG PO TB24: 75.0000 mg | ORAL_TABLET | Freq: Every day | ORAL | 3 refills | Status: DC

## 2022-08-22 MED ORDER — RIVAROXABAN 20 MG PO TABS: 20.0000 mg | ORAL_TABLET | Freq: Every day | ORAL | 1 refills | Status: DC

## 2022-08-22 NOTE — Patient Instructions (Signed)
Medication Instructions:  Your physician has recommended you make the following change in your medication:  1.) increase metoprolol succinate (Toprol XL) to 75 mg daily  *If you need a refill on your cardiac medications before your next appointment, please call your pharmacy*   Lab Work: none   Testing/Procedures: due in about 1 month Your physician has requested that you have an echocardiogram. Echocardiography is a painless test that uses sound waves to create images of your heart. It provides your doctor with information about the size and shape of your heart and how well your heart's chambers and valves are working. This procedure takes approximately one hour. There are no restrictions for this procedure. Please do NOT wear cologne, perfume, aftershave, or lotions (deodorant is allowed). Please arrive 15 minutes prior to your appointment time.    Follow-Up: At [facility], you and your health needs are our priority.  As part of our continuing mission to provide you with exceptional heart care, we have created designated Provider Care Teams.  These Care Teams include your primary Cardiologist (physician) and Advanced Practice Providers (APPs -  Physician Assistants and Nurse Practitioners) who all work together to provide you with the care you need, when you need it.   Your next appointment:   3 month(s)  Provider:   Advanced Practice Provider (NP or PA-C)

## 2022-08-23 ENCOUNTER — Telehealth: Payer: Self-pay | Admitting: Internal Medicine

## 2022-08-23 NOTE — Telephone Encounter (Signed)
Error

## 2022-09-06 ENCOUNTER — Telehealth: Payer: Self-pay | Admitting: Internal Medicine

## 2022-09-06 DIAGNOSIS — K080 Exfoliation of teeth due to systemic causes: Secondary | ICD-10-CM | POA: Diagnosis not present

## 2022-09-06 NOTE — Telephone Encounter (Signed)
Patient wants to speak directly with Dr. Dara Hoyer nurse.

## 2022-09-06 NOTE — Telephone Encounter (Signed)
Patient had questions about her afib clinic appointment.  All questions have been answered.  No further needs at this time.

## 2022-09-12 ENCOUNTER — Encounter (HOSPITAL_COMMUNITY): Payer: Self-pay | Admitting: Physician Assistant

## 2022-09-12 ENCOUNTER — Ambulatory Visit (HOSPITAL_COMMUNITY)
Admission: RE | Admit: 2022-09-12 | Discharge: 2022-09-12 | Disposition: A | Discharge status: Home / Self Care | Payer: Medicare Other | Source: Ambulatory Visit | Attending: Physician Assistant | Admitting: Physician Assistant

## 2022-09-12 VITALS — BP 134/98 | HR 119 | Ht 64.0 in | Wt 168.2 lb

## 2022-09-12 DIAGNOSIS — I4819 Other persistent atrial fibrillation: Secondary | ICD-10-CM | POA: Diagnosis not present

## 2022-09-12 DIAGNOSIS — Z7901 Long term (current) use of anticoagulants: Secondary | ICD-10-CM | POA: Insufficient documentation

## 2022-09-12 DIAGNOSIS — Z79899 Other long term (current) drug therapy: Secondary | ICD-10-CM | POA: Diagnosis not present

## 2022-09-12 DIAGNOSIS — D6869 Other thrombophilia: Secondary | ICD-10-CM | POA: Diagnosis not present

## 2022-09-12 DIAGNOSIS — Z006 Encounter for examination for normal comparison and control in clinical research program: Secondary | ICD-10-CM

## 2022-09-12 MED ORDER — DILTIAZEM HCL ER COATED BEADS 180 MG PO CP24: 180.0000 mg | ORAL_CAPSULE | Freq: Every day | ORAL | 3 refills | Status: DC

## 2022-09-12 NOTE — Research (Signed)
[place] Informed Consent   Subject Name: Alexandria [name]  Subject met inclusion and exclusion criteria.  The informed consent form, study requirements and expectations were reviewed with the subject and questions and concerns were addressed prior to the signing of the consent form.  The subject verbalized understanding of the trial requirements.  The subject agreed to participate in the [place] trial and signed the informed consent on 25/Mar/2024.  The informed consent was obtained prior to performance of any protocol-specific procedures for the subject.  A copy of the signed informed consent was given to the subject and a copy was placed in the subject's medical record.   [name] Bladen Umar

## 2022-09-12 NOTE — Patient Instructions (Signed)
Stop metoprolol  Start Cardizem 180mg once a day 

## 2022-09-12 NOTE — Progress Notes (Signed)
Primary Care Physician: [name], MD Primary Cardiologist: Dr Ali Lowe Primary Electrophysiologist: none Referring Physician: Dr Elby Beck is a 77 y.o. female with a history of atrial fibrillation who presents for consultation in the [facility].  The patient was initially diagnosed with atrial fibrillation by her PCP incidentally 07/2022. She was started on Toprol for rate control and Xarelto for a CHADS2VASC score of 3. She was seen by Dr Ali Lowe on 08/22/22 who increased her Toprol to 75 mg daily.   Today, patient remains completely asymptomatic with her afib. However, her heart rates remain elevated despite increased Toprol. No bleeding issues on anticoagulation. She has had issues with diarrhea since starting metoprolol, having 3-4 very loose stools daily.   Today, she denies symptoms of palpitations, chest pain, shortness of breath, orthopnea, PND, lower extremity edema, dizziness, presyncope, syncope, snoring, daytime somnolence, bleeding, or neurologic sequela. The patient is tolerating medications without difficulties and is otherwise without complaint today.    Atrial Fibrillation Risk Factors:  she does not have symptoms or diagnosis of sleep apnea. she does not have a history of rheumatic fever. The patient does not have a history of early familial atrial fibrillation or other arrhythmias.  she has a BMI of Body mass index is 28.87 kg/m.Marland Kitchen Filed Weights   09/12/22 1538  Weight: 76.3 kg    No family history on file.   Atrial Fibrillation Management history:  Previous antiarrhythmic drugs: none Previous cardioversions: none Previous ablations: none CHADS2VASC score: 3 Anticoagulation history: Xarelto    Past Medical History:  Diagnosis Date   Allergy    Asthma, mild intermittent 02/01/2016   Atrial fibrillation (HCC)    Past Surgical History:  Procedure Laterality Date   ABDOMINAL HYSTERECTOMY       Current Outpatient Medications  Medication Sig Dispense Refill   Biotin 5000 MCG CAPS Take by mouth.     calcium carbonate (OSCAL) 1500 (600 Ca) MG TABS tablet Take 600 mg of elemental calcium by mouth 2 (two) times daily with a meal.     cholecalciferol (VITAMIN D) 1000 units tablet Take 2,000 Units by mouth daily.     estradiol (CLIMARA - DOSED IN MG/24 HR) 0.1 mg/24hr patch 0.1 mg once a week.     hydrocortisone 2.5 % cream Apply topically.     loratadine (CLARITIN) 10 MG tablet Take 10 mg by mouth daily.     metoprolol succinate (TOPROL XL) 50 MG 24 hr tablet Take 1.5 tablets (75 mg total) by mouth daily. Take with or immediately following a meal. 135 tablet 3   mometasone (ELOCON) 0.1 % cream Apply to the area of rash in a very thin layer in the morning and at bedtime for no more than 7 days in a row. If needed for longer you may restart after 3 day break. 45 g 1   pyridOXINE (VITAMIN B-6) 100 MG tablet Take 100 mg by mouth daily.     rivaroxaban (XARELTO) 20 MG TABS tablet Take 1 tablet (20 mg total) by mouth daily with supper. 90 tablet 1   vitamin C (ASCORBIC ACID) 500 MG tablet Take 500 mg by mouth daily.     No current facility-administered medications for this encounter.    Allergies  Allergen Reactions   Sulfa Antibiotics    Aspirin Other (See Comments)    Tinnitus    Misc. Sulfonamide Containing Compounds Other (See Comments)   Tetanus Toxoid Other (See Comments)  Tetanus Toxoids     Social History   Socioeconomic History   Marital status: Single    Spouse name: Not on file   Number of children: Not on file   Years of education: Not on file   Highest education level: Not on file  Occupational History   Not on file  Tobacco Use   Smoking status: Former    Types: Cigarettes   Smokeless tobacco: Never   Tobacco comments:    Former smoke 09/12/22  Vaping Use   Vaping Use: Never used  Substance and Sexual Activity   Alcohol use: Yes    Alcohol/week: 1.0  standard drink of alcohol    Types: 1 Standard drinks or equivalent per week    Comment: 1 glass of wine/mixed drink 09/12/22   Drug use: No   Sexual activity: Not Currently  Other Topics Concern   Not on file  Social History Narrative   Not on file   Social Determinants of Health   Financial Resource Strain: Low Risk  (12/17/2021)   Overall Financial Resource Strain (CARDIA)    Difficulty of Paying Living Expenses: Not hard at all  Food Insecurity: No Food Insecurity (12/17/2021)   Hunger Vital Sign    Worried About Running Out of Food in the Last Year: Never true    Ran Out of Food in the Last Year: Never true  Transportation Needs: No Transportation Needs (12/17/2021)   PRAPARE - Hydrologist (Medical): No    Lack of Transportation (Non-Medical): No  Physical Activity: Sufficiently Active (12/17/2021)   Exercise Vital Sign    Days of Exercise per Week: 3 days    Minutes of Exercise per Session: 60 min  Stress: No Stress Concern Present (12/17/2021)   [place]    Feeling of Stress : Not at all  Social Connections: Not on file  Intimate Partner Violence: Not on file     ROS- All systems are reviewed and negative except as per the HPI above.  Physical Exam: Vitals:   09/12/22 1538  BP: (!) 134/98  Pulse: (!) 119  Weight: 76.3 kg  Height: 5\' 4"  (1.626 m)    GEN- The patient is a well appearing elderly female, alert and oriented x 3 today.   Head- normocephalic, atraumatic Eyes-  Sclera clear, conjunctiva pink Ears- hearing intact Oropharynx- clear Neck- supple  Lungs- Clear to ausculation bilaterally, normal work of breathing Heart- irregular rate and rhythm, no murmurs, rubs or gallops  GI- soft, NT, ND, + BS Extremities- no clubbing, cyanosis, or edema MS- no significant deformity or atrophy Skin- no rash or lesion Psych- euthymic mood, full affect Neuro- strength and  sensation are intact  Wt Readings from Last 3 Encounters:  09/12/22 76.3 kg  08/22/22 73 kg  08/15/22 75.5 kg    EKG today demonstrates  Afib with RVR Vent. rate 119 BPM PR interval * ms QRS duration 86 ms QT/QTcB 322/452 ms   Epic records are reviewed at length today  CHA2DS2-VASc Score = 3  The patient's score is based upon: CHF History: 0 HTN History: 0 Diabetes History: 0 Stroke History: 0 Vascular Disease History: 0 Age Score: 2 Gender Score: 1       ASSESSMENT AND PLAN: 1. Persistent Atrial Fibrillation (ICD10:  I48.19) The patient's CHA2DS2-VASc score is 3, indicating a 3.2% annual risk of stroke.   Patient remains in rapid afib today.  She is  having GI side effects since starting BB. Will change her her diltiazem 180 mg daily. Will have her return next week to reevaluate rate control.  Continue Xarelto 20 mg daily. We discussed DCCV if her hearts rates prove difficult to control.  She is scheduled for echocardiogram next week.   2. Secondary Hypercoagulable State (ICD10:  D68.69) The patient is at significant risk for stroke/thromboembolism based upon her CHA2DS2-VASc Score of 3.  Continue Rivaroxaban (Xarelto).     Follow up next week in AF clinic.    [facility] [address] [place], [place] [address] [phone] 09/12/2022 3:46 PM

## 2022-09-19 ENCOUNTER — Encounter (HOSPITAL_COMMUNITY): Payer: Self-pay | Admitting: Physician Assistant

## 2022-09-19 ENCOUNTER — Ambulatory Visit (HOSPITAL_COMMUNITY)
Admission: RE | Admit: 2022-09-19 | Discharge: 2022-09-19 | Disposition: A | Discharge status: Home / Self Care | Payer: Medicare Other | Source: Ambulatory Visit | Attending: Physician Assistant | Admitting: Physician Assistant

## 2022-09-19 VITALS — BP 146/90 | HR 111 | Ht 64.0 in | Wt 168.6 lb

## 2022-09-19 DIAGNOSIS — Z7901 Long term (current) use of anticoagulants: Secondary | ICD-10-CM | POA: Diagnosis not present

## 2022-09-19 DIAGNOSIS — Z87891 Personal history of nicotine dependence: Secondary | ICD-10-CM | POA: Insufficient documentation

## 2022-09-19 DIAGNOSIS — I4819 Other persistent atrial fibrillation: Secondary | ICD-10-CM | POA: Diagnosis not present

## 2022-09-19 DIAGNOSIS — D6869 Other thrombophilia: Secondary | ICD-10-CM | POA: Insufficient documentation

## 2022-09-19 DIAGNOSIS — Z79899 Other long term (current) drug therapy: Secondary | ICD-10-CM | POA: Insufficient documentation

## 2022-09-19 LAB — BASIC METABOLIC PANEL
Anion gap: 8 (ref 5–15)
BUN: 14 mg/dL (ref 8–23)
CO2: 27 mmol/L (ref 22–32)
Calcium: 9.4 mg/dL (ref 8.9–10.3)
Chloride: 101 mmol/L (ref 98–111)
Creatinine, Ser: 0.82 mg/dL (ref 0.44–1.00)
GFR, Estimated: >60 mL/min (ref >60)
Glucose, Bld: 79 mg/dL (ref 70–99)
Potassium: 4.2 mmol/L (ref 3.5–5.1)
Sodium: 136 mmol/L (ref 135–145)

## 2022-09-19 LAB — CBC
HCT: 50 % — ABNORMAL HIGH (ref 36.0–46.0)
Hemoglobin: 16.6 g/dL — ABNORMAL HIGH (ref 12.0–15.0)
MCH: 30.1 pg (ref 26.0–34.0)
MCHC: 33.2 g/dL (ref 30.0–36.0)
MCV: 90.6 fL (ref 80.0–100.0)
Platelets: 251 10*3/uL (ref 150–400)
RBC: 5.52 MIL/uL — ABNORMAL HIGH (ref 3.87–5.11)
RDW: 13.2 % (ref 11.5–15.5)
WBC: 5.9 10*3/uL (ref 4.0–10.5)
nRBC: 0 % (ref 0.0–0.2)

## 2022-09-19 NOTE — Patient Instructions (Signed)
Cardioversion scheduled for: September 29, 2022   - Arrive at the [facility] and go to admitting at 10:30am   - Do not eat or drink anything after midnight the night prior to your procedure.   - Take all your morning medication (except diabetic medications) with a sip of water prior to arrival.  - You will not be able to drive home after your procedure.    - Do NOT miss any doses of your blood thinner - if you should miss a dose please notify our office immediately.   - If you feel as if you go back into normal rhythm prior to scheduled cardioversion, please notify our office immediately.   If your procedure is canceled in the cardioversion suite you will be charged a cancellation fee.    Hold medication 7 days prior to scheduled procedure/anesthesia.  Restart medication on the normal dosing day after scheduled procedure/anesthesia  Dulaglutide (Trulicity) Exenatide extended release (Bydureon bcise) Semaglutide (Ozempic) (WEGOVY)  Tirzepatide (Mounjaro)     Hold medication 24 hours prior to scheduled procedure/anesthesia.   Restart medication on the following day after scheduled procedure/anesthesia   Exenatide (Byetta)  Liraglutide (Victoza, Saxenda)  Lixisenatide (Adlyxin)  Semaglutide (Rybelsus) Polyethylene Glycol Loxenatide   For those patients who have a scheduled procedure/anesthesia on the same day of the week as their dose, hold the medication on the day of surgery.  They can take their scheduled dose the week before.  **Patients on the above medications scheduled for elective procedures that have not held the medication for the appropriate amount of time are at risk of cancellation or change in the anesthetic plan.

## 2022-09-19 NOTE — Progress Notes (Signed)
Primary Care Physician: [name], MD Primary Cardiologist: Dr Ali Lowe Primary Electrophysiologist: none Referring Physician: Dr Elby Beck is a 77 y.o. female with a history of atrial fibrillation who presents for follow up in the [facility]. The patient was initially diagnosed with atrial fibrillation by her PCP incidentally 07/2022. She was started on Toprol for rate control and Xarelto for a CHADS2VASC score of 3. She was seen by Dr Ali Lowe on 08/22/22 who increased her Toprol to 75 mg daily. At her visit 09/12/22, she remained in rapid afib. Her Toprol was changed due to diltiazem due to GI side effects.   On follow up today, patient remains in afib today, rates a little improved. Her GI issues resolved with stopping BB. She has noted some foot swelling but admits she was on her feet all day yesterday singing in the church choir. No bleeding issues on anticoagulation.   Today, she denies symptoms of palpitations, chest pain, shortness of breath, orthopnea, PND, dizziness, presyncope, syncope, snoring, daytime somnolence, bleeding, or neurologic sequela. The patient is tolerating medications without difficulties and is otherwise without complaint today.    Atrial Fibrillation Risk Factors:  she does not have symptoms or diagnosis of sleep apnea. she does not have a history of rheumatic fever. The patient does not have a history of early familial atrial fibrillation or other arrhythmias.  she has a BMI of Body mass index is 28.94 kg/m.Marland Kitchen Filed Weights   09/19/22 1103  Weight: 76.5 kg   No family history on file.   Atrial Fibrillation Management history:  Previous antiarrhythmic drugs: none Previous cardioversions: none Previous ablations: none CHADS2VASC score: 3 Anticoagulation history: Xarelto    Past Medical History:  Diagnosis Date   Allergy    Asthma, mild intermittent 02/01/2016   Atrial fibrillation    Past  Surgical History:  Procedure Laterality Date   ABDOMINAL HYSTERECTOMY      Current Outpatient Medications  Medication Sig Dispense Refill   Biotin 5000 MCG CAPS Take by mouth.     calcium carbonate (OSCAL) 1500 (600 Ca) MG TABS tablet Take 600 mg of elemental calcium by mouth 2 (two) times daily with a meal.     cholecalciferol (VITAMIN D) 1000 units tablet Take 2,000 Units by mouth daily.     diltiazem (CARDIZEM CD) 180 MG 24 hr capsule Take 1 capsule (180 mg total) by mouth daily. 30 capsule 3   estradiol (CLIMARA - DOSED IN MG/24 HR) 0.1 mg/24hr patch 0.1 mg once a week.     hydrocortisone 2.5 % cream Apply topically.     loratadine (CLARITIN) 10 MG tablet Take 10 mg by mouth daily.     mometasone (ELOCON) 0.1 % cream Apply to the area of rash in a very thin layer in the morning and at bedtime for no more than 7 days in a row. If needed for longer you may restart after 3 day break. 45 g 1   pyridOXINE (VITAMIN B-6) 100 MG tablet Take 100 mg by mouth daily.     rivaroxaban (XARELTO) 20 MG TABS tablet Take 1 tablet (20 mg total) by mouth daily with supper. 90 tablet 1   vitamin C (ASCORBIC ACID) 500 MG tablet Take 500 mg by mouth daily.     No current facility-administered medications for this encounter.    Allergies  Allergen Reactions   Sulfa Antibiotics    Aspirin Other (See Comments)    Tinnitus  Misc. Sulfonamide Containing Compounds Other (See Comments)   Tetanus Toxoid Other (See Comments)   Tetanus Toxoids     Social History   Socioeconomic History   Marital status: Single    Spouse name: Not on file   Number of children: Not on file   Years of education: Not on file   Highest education level: Not on file  Occupational History   Not on file  Tobacco Use   Smoking status: Former    Types: Cigarettes   Smokeless tobacco: Never   Tobacco comments:    Former smoker 09/12/22  Vaping Use   Vaping Use: Never used  Substance and Sexual Activity   Alcohol use: Yes     Alcohol/week: 1.0 standard drink of alcohol    Types: 1 Standard drinks or equivalent per week    Comment: 1 glass of wine/mixed drink 09/12/22   Drug use: No   Sexual activity: Not Currently  Other Topics Concern   Not on file  Social History Narrative   Not on file   Social Determinants of Health   Financial Resource Strain: Low Risk  (12/17/2021)   Overall Financial Resource Strain (CARDIA)    Difficulty of Paying Living Expenses: Not hard at all  Food Insecurity: No Food Insecurity (12/17/2021)   Hunger Vital Sign    Worried About Running Out of Food in the Last Year: Never true    Ran Out of Food in the Last Year: Never true  Transportation Needs: No Transportation Needs (12/17/2021)   PRAPARE - Hydrologist (Medical): No    Lack of Transportation (Non-Medical): No  Physical Activity: Sufficiently Active (12/17/2021)   Exercise Vital Sign    Days of Exercise per Week: 3 days    Minutes of Exercise per Session: 60 min  Stress: No Stress Concern Present (12/17/2021)   [place]    Feeling of Stress : Not at all  Social Connections: Not on file  Intimate Partner Violence: Not on file     ROS- All systems are reviewed and negative except as per the HPI above.  Physical Exam: Vitals:   09/19/22 1103  BP: (!) 146/90  Pulse: (!) 111  Weight: 76.5 kg  Height: 5\' 4"  (1.626 m)    GEN- The patient is a well appearing elderly female, alert and oriented x 3 today.   HEENT-head normocephalic, atraumatic, sclera clear, conjunctiva pink, hearing intact, trachea midline. Lungs- Clear to ausculation bilaterally, normal work of breathing Heart- irregular rate and rhythm, no murmurs, rubs or gallops  GI- soft, NT, ND, + BS Extremities- no clubbing, cyanosis, 1+ pedal edema MS- no significant deformity or atrophy Skin- no rash or lesion Psych- euthymic mood, full affect Neuro- strength  and sensation are intact   Wt Readings from Last 3 Encounters:  09/19/22 76.5 kg  09/12/22 76.3 kg  08/22/22 73 kg    EKG today demonstrates  Vent. rate 111 BPM PR interval * ms QRS duration 84 ms QT/QTcB 334/454 ms   Epic records are reviewed at length today  CHA2DS2-VASc Score = 3  The patient's score is based upon: CHF History: 0 HTN History: 0 Diabetes History: 0 Stroke History: 0 Vascular Disease History: 0 Age Score: 2 Gender Score: 1       ASSESSMENT AND PLAN: 1. Persistent Atrial Fibrillation (ICD10:  I48.19) The patient's CHA2DS2-VASc score is 3, indicating a 3.2% annual risk of stroke.  Patient in afib with elevated rates today.  Did not tolerate BB due to GI side effects. Continue diltiazem 180 mg daily After discussing the risks and benefits, will arrange for DCCV.  Continue Xarelto 20 mg daily. She is scheduled for echocardiogram 09/23/22 May be a good ablation candidate if she has quick return of her afib.   2. Secondary Hypercoagulable State (ICD10:  D68.69) The patient is at significant risk for stroke/thromboembolism based upon her CHA2DS2-VASc Score of 3.  Continue Rivaroxaban (Xarelto).    Follow up in the AF clinic post DCCV.    [facility] [address] [place], [place] [address] [phone] 09/19/2022 11:11 AM

## 2022-09-19 NOTE — H&P (View-Only) (Signed)
  Primary Care Physician: Lalonde, John C, MD Primary Cardiologist: Dr Thukkani Primary Electrophysiologist: none Referring Physician: Dr Thukkani    Alexandria [name] is a 77 y.o. female with a history of atrial fibrillation who presents for follow up in the [facility]. The patient was initially diagnosed with atrial fibrillation by her PCP incidentally 07/2022. She was started on Toprol for rate control and Xarelto for a CHADS2VASC score of 3. She was seen by Dr Thukkani on 08/22/22 who increased her Toprol to 75 mg daily. At her visit 09/12/22, she remained in rapid afib. Her Toprol was changed due to diltiazem due to GI side effects.   On follow up today, patient remains in afib today, rates a little improved. Her GI issues resolved with stopping BB. She has noted some foot swelling but admits she was on her feet all day yesterday singing in the church choir. No bleeding issues on anticoagulation.   Today, she denies symptoms of palpitations, chest pain, shortness of breath, orthopnea, PND, dizziness, presyncope, syncope, snoring, daytime somnolence, bleeding, or neurologic sequela. The patient is tolerating medications without difficulties and is otherwise without complaint today.    Atrial Fibrillation Risk Factors:  she does not have symptoms or diagnosis of sleep apnea. she does not have a history of rheumatic fever. The patient does not have a history of early familial atrial fibrillation or other arrhythmias.  she has a BMI of Body mass index is 28.94 kg/m.. Filed Weights   09/19/22 1103  Weight: 76.5 kg   No family history on file.   Atrial Fibrillation Management history:  Previous antiarrhythmic drugs: none Previous cardioversions: none Previous ablations: none CHADS2VASC score: 3 Anticoagulation history: Xarelto    Past Medical History:  Diagnosis Date   Allergy    Asthma, mild intermittent 02/01/2016   Atrial fibrillation    Past  Surgical History:  Procedure Laterality Date   ABDOMINAL HYSTERECTOMY      Current Outpatient Medications  Medication Sig Dispense Refill   Biotin 5000 MCG CAPS Take by mouth.     calcium carbonate (OSCAL) 1500 (600 Ca) MG TABS tablet Take 600 mg of elemental calcium by mouth 2 (two) times daily with a meal.     cholecalciferol (VITAMIN D) 1000 units tablet Take 2,000 Units by mouth daily.     diltiazem (CARDIZEM CD) 180 MG 24 hr capsule Take 1 capsule (180 mg total) by mouth daily. 30 capsule 3   estradiol (CLIMARA - DOSED IN MG/24 HR) 0.1 mg/24hr patch 0.1 mg once a week.     hydrocortisone 2.5 % cream Apply topically.     loratadine (CLARITIN) 10 MG tablet Take 10 mg by mouth daily.     mometasone (ELOCON) 0.1 % cream Apply to the area of rash in a very thin layer in the morning and at bedtime for no more than 7 days in a row. If needed for longer you may restart after 3 day break. 45 g 1   pyridOXINE (VITAMIN B-6) 100 MG tablet Take 100 mg by mouth daily.     rivaroxaban (XARELTO) 20 MG TABS tablet Take 1 tablet (20 mg total) by mouth daily with supper. 90 tablet 1   vitamin C (ASCORBIC ACID) 500 MG tablet Take 500 mg by mouth daily.     No current facility-administered medications for this encounter.    Allergies  Allergen Reactions   Sulfa Antibiotics    Aspirin Other (See Comments)    Tinnitus      Misc. Sulfonamide Containing Compounds Other (See Comments)   Tetanus Toxoid Other (See Comments)   Tetanus Toxoids     Social History   Socioeconomic History   Marital status: Single    Spouse name: Not on file   Number of children: Not on file   Years of education: Not on file   Highest education level: Not on file  Occupational History   Not on file  Tobacco Use   Smoking status: Former    Types: Cigarettes   Smokeless tobacco: Never   Tobacco comments:    Former smoker 09/12/22  Vaping Use   Vaping Use: Never used  Substance and Sexual Activity   Alcohol use: Yes     Alcohol/week: 1.0 standard drink of alcohol    Types: 1 Standard drinks or equivalent per week    Comment: 1 glass of wine/mixed drink 09/12/22   Drug use: No   Sexual activity: Not Currently  Other Topics Concern   Not on file  Social History Narrative   Not on file   Social Determinants of Health   Financial Resource Strain: Low Risk  (12/17/2021)   Overall Financial Resource Strain (CARDIA)    Difficulty of Paying Living Expenses: Not hard at all  Food Insecurity: No Food Insecurity (12/17/2021)   Hunger Vital Sign    Worried About Running Out of Food in the Last Year: Never true    Ran Out of Food in the Last Year: Never true  Transportation Needs: No Transportation Needs (12/17/2021)   PRAPARE - Transportation    Lack of Transportation (Medical): No    Lack of Transportation (Non-Medical): No  Physical Activity: Sufficiently Active (12/17/2021)   Exercise Vital Sign    Days of Exercise per Week: 3 days    Minutes of Exercise per Session: 60 min  Stress: No Stress Concern Present (12/17/2021)   [employer] - Occupational Stress Questionnaire    Feeling of Stress : Not at all  Social Connections: Not on file  Intimate Partner Violence: Not on file     ROS- All systems are reviewed and negative except as per the HPI above.  Physical Exam: Vitals:   09/19/22 1103  BP: (!) 146/90  Pulse: (!) 111  Weight: 76.5 kg  Height: 5' 4" (1.626 m)    GEN- The patient is a well appearing elderly female, alert and oriented x 3 today.   HEENT-head normocephalic, atraumatic, sclera clear, conjunctiva pink, hearing intact, trachea midline. Lungs- Clear to ausculation bilaterally, normal work of breathing Heart- irregular rate and rhythm, no murmurs, rubs or gallops  GI- soft, NT, ND, + BS Extremities- no clubbing, cyanosis, 1+ pedal edema MS- no significant deformity or atrophy Skin- no rash or lesion Psych- euthymic mood, full affect Neuro- strength  and sensation are intact   Wt Readings from Last 3 Encounters:  09/19/22 76.5 kg  09/12/22 76.3 kg  08/22/22 73 kg    EKG today demonstrates  Vent. rate 111 BPM PR interval * ms QRS duration 84 ms QT/QTcB 334/454 ms   Epic records are reviewed at length today  CHA2DS2-VASc Score = 3  The patient's score is based upon: CHF History: 0 HTN History: 0 Diabetes History: 0 Stroke History: 0 Vascular Disease History: 0 Age Score: 2 Gender Score: 1       ASSESSMENT AND PLAN: 1. Persistent Atrial Fibrillation (ICD10:  I48.19) The patient's CHA2DS2-VASc score is 3, indicating a 3.2% annual risk of stroke.     Patient in afib with elevated rates today.  Did not tolerate BB due to GI side effects. Continue diltiazem 180 mg daily After discussing the risks and benefits, will arrange for DCCV.  Continue Xarelto 20 mg daily. She is scheduled for echocardiogram 09/23/22 May be a good ablation candidate if she has quick return of her afib.   2. Secondary Hypercoagulable State (ICD10:  D68.69) The patient is at significant risk for stroke/thromboembolism based upon her CHA2DS2-VASc Score of 3.  Continue Rivaroxaban (Xarelto).    Follow up in the AF clinic post DCCV.    Ricky Kaitlynne Wenz PA-C [facility] [address] [place], [place] [address] [phone] 09/19/2022 11:11 AM 

## 2022-09-20 ENCOUNTER — Other Ambulatory Visit (HOSPITAL_COMMUNITY): Payer: Self-pay | Admitting: Physician Assistant

## 2022-09-20 DIAGNOSIS — I4819 Other persistent atrial fibrillation: Secondary | ICD-10-CM

## 2022-09-23 ENCOUNTER — Ambulatory Visit (HOSPITAL_COMMUNITY): Payer: Medicare Other | Attending: Cardiology

## 2022-09-23 DIAGNOSIS — I4891 Unspecified atrial fibrillation: Secondary | ICD-10-CM | POA: Diagnosis not present

## 2022-09-23 DIAGNOSIS — I480 Paroxysmal atrial fibrillation: Secondary | ICD-10-CM

## 2022-09-25 LAB — ECHOCARDIOGRAM COMPLETE
Area-P 1/2: 4.8 cm2
S' Lateral: 2.9 cm

## 2022-09-26 ENCOUNTER — Telehealth (HOSPITAL_COMMUNITY): Payer: Self-pay | Admitting: *Deleted

## 2022-09-26 MED ORDER — BISOPROLOL FUMARATE 10 MG PO TABS: 10.0000 mg | ORAL_TABLET | Freq: Every day | ORAL | 3 refills | Status: DC

## 2022-09-26 NOTE — Telephone Encounter (Signed)
Patient called in stating she is having increase swelling in lower extremities since starting diltiazem. Pt has tried keeping legs elevated but swelling always returns. Discussed with [name] PA will stop diltiazem and start bisoprolol 10mg  once a day as pt did not tolerate metoprolol due to GI upset. Pt in agreement,.

## 2022-09-28 ENCOUNTER — Telehealth: Payer: Self-pay | Admitting: *Deleted

## 2022-09-28 MED ORDER — LOSARTAN POTASSIUM 25 MG PO TABS: 12.5000 mg | ORAL_TABLET | Freq: Every day | ORAL | 3 refills | Status: DC

## 2022-09-28 NOTE — Telephone Encounter (Signed)
-----   Message from Orbie Pyo, MD sent at 09/25/2022  2:48 PM EDT ----- Let's start losartan 12.5mg  qpm; no labs required.

## 2022-09-28 NOTE — Telephone Encounter (Signed)
Spoke w the patient.  She voices understanding and will start losartan 12.5 mg daily at bedtime.  Tomorrow is her cardioversion.

## 2022-09-28 NOTE — Pre-Procedure Instructions (Signed)
Attempted to call patient regarding procedure instructions.  Left voice mail on the following.  Nothing to eat or drink after midnight. You need a responsible adult to drive you home and stay with you for 24 hours. You are on Xarlto, if you have missed any doses please let office know right away.

## 2022-09-29 ENCOUNTER — Ambulatory Visit (HOSPITAL_COMMUNITY)
Admission: RE | Admit: 2022-09-29 | Discharge: 2022-09-29 | Disposition: A | Discharge status: Home / Self Care | Payer: Medicare Other | Attending: Internal Medicine | Admitting: Internal Medicine

## 2022-09-29 ENCOUNTER — Other Ambulatory Visit: Payer: Self-pay

## 2022-09-29 ENCOUNTER — Encounter (HOSPITAL_COMMUNITY): Payer: Self-pay | Admitting: Internal Medicine

## 2022-09-29 ENCOUNTER — Ambulatory Visit (HOSPITAL_BASED_OUTPATIENT_CLINIC_OR_DEPARTMENT_OTHER): Payer: Medicare Other | Admitting: Anesthesiology

## 2022-09-29 ENCOUNTER — Ambulatory Visit (HOSPITAL_COMMUNITY): Payer: Medicare Other | Admitting: Anesthesiology

## 2022-09-29 ENCOUNTER — Encounter (HOSPITAL_COMMUNITY)
Admission: RE | Disposition: A | Discharge status: Home / Self Care | Payer: Self-pay | Source: Home / Self Care | Attending: Internal Medicine

## 2022-09-29 DIAGNOSIS — Z87891 Personal history of nicotine dependence: Secondary | ICD-10-CM

## 2022-09-29 DIAGNOSIS — I4891 Unspecified atrial fibrillation: Secondary | ICD-10-CM

## 2022-09-29 DIAGNOSIS — Z7901 Long term (current) use of anticoagulants: Secondary | ICD-10-CM | POA: Insufficient documentation

## 2022-09-29 DIAGNOSIS — I4819 Other persistent atrial fibrillation: Secondary | ICD-10-CM

## 2022-09-29 DIAGNOSIS — J45909 Unspecified asthma, uncomplicated: Secondary | ICD-10-CM

## 2022-09-29 DIAGNOSIS — Z79899 Other long term (current) drug therapy: Secondary | ICD-10-CM | POA: Diagnosis not present

## 2022-09-29 DIAGNOSIS — D6869 Other thrombophilia: Secondary | ICD-10-CM | POA: Diagnosis not present

## 2022-09-29 HISTORY — PX: CARDIOVERSION: SHX1299

## 2022-09-29 SURGERY — CARDIOVERSION
Anesthesia: General

## 2022-09-29 MED ORDER — LIDOCAINE HCL (CARDIAC) PF 100 MG/5ML IV SOSY
PREFILLED_SYRINGE | INTRAVENOUS | Status: DC | PRN
  Administered 2022-09-29: 50 mg via INTRAVENOUS

## 2022-09-29 MED ORDER — SODIUM CHLORIDE 0.9 % IV SOLN: INTRAVENOUS | Status: DC

## 2022-09-29 MED ORDER — PROPOFOL 10 MG/ML IV BOLUS
INTRAVENOUS | Status: DC | PRN
  Administered 2022-09-29: 70 mg via INTRAVENOUS
  Administered 2022-09-29: 10 mg via INTRAVENOUS

## 2022-09-29 SURGICAL SUPPLY — 1 items: ELECT DEFIB PAD ADLT CADENCE (PAD) ×2 IMPLANT

## 2022-09-29 NOTE — Transfer of Care (Signed)
Immediate Anesthesia Transfer of Care Note  Patient: Alexandria [name]  Procedure(s) Performed: CARDIOVERSION  Patient Location: PACU  Anesthesia Type:MAC  Level of Consciousness: drowsy  Airway & Oxygen Therapy: Patient Spontanous Breathing  Post-op Assessment: Report given to RN and Post -op Vital signs reviewed and stable  Post vital signs: Reviewed and stable  Last Vitals:  Vitals Value Taken Time  BP    Temp    Pulse    Resp    SpO2      Last Pain: There were no vitals filed for this visit.       Complications: No notable events documented.

## 2022-09-29 NOTE — CV Procedure (Signed)
    Electrical Cardioversion Procedure Note Alexandria [name] [id number] 1946-01-06  Procedure: Electrical Cardioversion Indications:  Atrial Fibrillation  Time Out: Verified patient identification, verified procedure,medications/allergies/relevent history reviewed, required imaging and test results available.  Performed  Procedure Details  The patient was NPO after midnight. Anesthesia was administered at the beside  by Dr.Germoth with 80 mg propofol.  Cardioversion was done with synchronized biphasic defibrillation with AP pads with 200 Joules.  The patient converted to normal sinus rhythm. The patient tolerated the procedure well   IMPRESSION:  Successful cardioversion of atrial fibrillation    Alexandria [name] Alexandria [name] 09/29/2022, 11:18 AM

## 2022-09-29 NOTE — Anesthesia Postprocedure Evaluation (Signed)
Anesthesia Post Note  Patient: Alexandria [name]  Procedure(s) Performed: CARDIOVERSION     Patient location during evaluation: Cath Lab Anesthesia Type: General Level of consciousness: sedated and patient cooperative Pain management: pain level controlled Vital Signs Assessment: post-procedure vital signs reviewed and stable Respiratory status: spontaneous breathing Cardiovascular status: stable Anesthetic complications: no   Encounter Notable Events  Notable Event Outcome Phase Comment  None  Intraprocedure     Last Vitals:  Vitals:   09/29/22 1150 09/29/22 1151  BP: (!) 90/54 108/64  Pulse: 61 60  Resp: 11 (!) 8  Temp:    SpO2: 98% 98%    Last Pain: There were no vitals filed for this visit.               Lewie Loron

## 2022-09-29 NOTE — Interval H&P Note (Signed)
History and Physical Interval Note:  09/29/2022 10:49 AM  Alexandria [name]  has presented today for surgery, with the diagnosis of afib.  The various methods of treatment have been discussed with the patient and family. After consideration of risks, benefits and other options for treatment, the patient has consented to  Procedure(s): CARDIOVERSION (N/A) as a surgical intervention.  The patient's history has been reviewed, patient examined, no change in status, stable for surgery.  I have reviewed the patient's chart and labs.  Questions were answered to the patient's satisfaction.     Vanissa Strength A Alishia Lebo

## 2022-09-29 NOTE — Anesthesia Preprocedure Evaluation (Addendum)
Anesthesia Evaluation  Patient identified by MRN, date of birth, ID band Patient awake    Reviewed: Allergy & Precautions, NPO status , Patient's Chart, lab work & pertinent test results  Airway Mallampati: II  TM Distance: >3 FB Neck ROM: Full    Dental no notable dental hx. (+) Dental Advisory Given   Pulmonary asthma , former smoker   Pulmonary exam normal breath sounds clear to auscultation       Cardiovascular Normal cardiovascular exam Rhythm:Regular Rate:Normal  Echo 09/23/2022  1. Left ventricular ejection fraction, by estimation, is 60 to 65%. The left ventricle has normal function. The left ventricle has no regional wall motion abnormalities. There is moderate left ventricular hypertrophy. Left ventricular diastolic parameters are indeterminate.   2. Right ventricular systolic function is normal. The right ventricular size is normal.   3. Left atrial size was moderately dilated.   4. Right atrial size was mildly dilated.   5. The mitral valve is normal in structure. Mild mitral valve regurgitation. No evidence of mitral stenosis.   6. The aortic valve is normal in structure. Aortic valve regurgitation is not visualized. No aortic stenosis is present.   7. The inferior vena cava is normal in size with greater than 50% respiratory variability, suggesting right atrial pressure of 3 mmHg.     Neuro/Psych negative neurological ROS     GI/Hepatic negative GI ROS, Neg liver ROS,,,  Endo/Other  negative endocrine ROS    Renal/GU negative Renal ROS     Musculoskeletal negative musculoskeletal ROS (+)    Abdominal   Peds  Hematology negative hematology ROS (+)   Anesthesia Other Findings   Reproductive/Obstetrics                             Anesthesia Physical Anesthesia Plan  ASA: 3  Anesthesia Plan: General   Post-op Pain Management: Minimal or no pain anticipated   Induction:  Intravenous  PONV Risk Score and Plan: 3 and Treatment may vary due to age or medical condition, Propofol infusion and TIVA  Airway Management Planned: Mask  Additional Equipment:   Intra-op Plan:   Post-operative Plan: Extubation in OR  Informed Consent: I have reviewed the patients History and Physical, chart, labs and discussed the procedure including the risks, benefits and alternatives for the proposed anesthesia with the patient or authorized representative who has indicated his/her understanding and acceptance.     Dental advisory given  Plan Discussed with: CRNA  Anesthesia Plan Comments:         Anesthesia Quick Evaluation

## 2022-10-06 ENCOUNTER — Ambulatory Visit (HOSPITAL_COMMUNITY): Payer: Medicare Other | Admitting: Physician Assistant

## 2022-10-10 ENCOUNTER — Ambulatory Visit (HOSPITAL_COMMUNITY)
Admission: RE | Admit: 2022-10-10 | Discharge: 2022-10-10 | Disposition: A | Discharge status: Home / Self Care | Payer: Medicare Other | Source: Ambulatory Visit | Attending: Physician Assistant | Admitting: Physician Assistant

## 2022-10-10 ENCOUNTER — Encounter (HOSPITAL_COMMUNITY): Payer: Self-pay | Admitting: Physician Assistant

## 2022-10-10 VITALS — BP 138/70 | HR 91 | Ht 64.0 in | Wt 167.6 lb

## 2022-10-10 DIAGNOSIS — M7989 Other specified soft tissue disorders: Secondary | ICD-10-CM | POA: Insufficient documentation

## 2022-10-10 DIAGNOSIS — I4819 Other persistent atrial fibrillation: Secondary | ICD-10-CM | POA: Diagnosis not present

## 2022-10-10 DIAGNOSIS — Z79899 Other long term (current) drug therapy: Secondary | ICD-10-CM | POA: Insufficient documentation

## 2022-10-10 DIAGNOSIS — I517 Cardiomegaly: Secondary | ICD-10-CM | POA: Insufficient documentation

## 2022-10-10 DIAGNOSIS — Z7901 Long term (current) use of anticoagulants: Secondary | ICD-10-CM | POA: Diagnosis not present

## 2022-10-10 DIAGNOSIS — D6869 Other thrombophilia: Secondary | ICD-10-CM | POA: Diagnosis not present

## 2022-10-10 NOTE — Progress Notes (Signed)
Primary Care Physician: Ronnald Nian, MD Primary Cardiologist: Dr Lynnette Caffey Primary Electrophysiologist: none Referring Physician: Dr Eula Flax is a 77 y.o. female with a history of atrial fibrillation who presents for follow up in the [facility]. The patient was initially diagnosed with atrial fibrillation by her PCP incidentally 07/2022. She was started on Toprol for rate control and Xarelto for a CHADS2VASC score of 3. She was seen by Dr Lynnette Caffey on 08/22/22 who increased her Toprol to 75 mg daily. At her visit 09/12/22, she remained in rapid afib. Her Toprol was changed due to diltiazem due to GI side effects, then changed to bisoprolol due to lower extremity edema.   On follow up today, patient is s/p DCCV on 09/29/22. Unfortunately, she is back in afib today. She was unaware of her afib. Her lower extremity edema improved off diltiazem.   Today, she denies symptoms of palpitations, chest pain, shortness of breath, orthopnea, PND, dizziness, presyncope, syncope, snoring, daytime somnolence, bleeding, or neurologic sequela. The patient is tolerating medications without difficulties and is otherwise without complaint today.    Atrial Fibrillation Risk Factors:  she does not have symptoms or diagnosis of sleep apnea. she does not have a history of rheumatic fever. The patient does not have a history of early familial atrial fibrillation or other arrhythmias.  she has a BMI of Body mass index is 28.77 kg/m.Marland Kitchen Filed Weights   10/10/22 1442  Weight: 76 kg    No family history on file.   Atrial Fibrillation Management history:  Previous antiarrhythmic drugs: none Previous cardioversions: none Previous ablations: none CHADS2VASC score: 3 Anticoagulation history: Xarelto    Past Medical History:  Diagnosis Date   Allergy    Asthma, mild intermittent 02/01/2016   Atrial fibrillation    Past Surgical History:  Procedure  Laterality Date   ABDOMINAL HYSTERECTOMY     CARDIOVERSION N/A 09/29/2022   Procedure: CARDIOVERSION;  Surgeon: Christell Constant, MD;  [facility];  Service: Cardiovascular;  Laterality: N/A;    Current Outpatient Medications  Medication Sig Dispense Refill   Ascorbic Acid (VITAMIN C) 1000 MG tablet Take 1,000 mg by mouth daily.     Biotin 5000 MCG CAPS Take 5,000 mcg by mouth daily.     bisoprolol (ZEBETA) 10 MG tablet Take 1 tablet (10 mg total) by mouth daily. 30 tablet 3   Calcium Carb-Cholecalciferol (CALCIUM + VITAMIN D3 PO) Take 1 tablet by mouth in the morning and at bedtime.     Cholecalciferol (VITAMIN D) 125 MCG (5000 UT) CAPS Take 5,000 Units by mouth daily.     estradiol (CLIMARA - DOSED IN MG/24 HR) 0.1 mg/24hr patch Place 0.1 mg onto the skin once a week.     loratadine (CLARITIN) 10 MG tablet Take 10 mg by mouth daily.     losartan (COZAAR) 25 MG tablet Take 0.5 tablets (12.5 mg total) by mouth at bedtime. 45 tablet 3   pyridOXINE (VITAMIN B-6) 100 MG tablet Take 100 mg by mouth daily.     rivaroxaban (XARELTO) 20 MG TABS tablet Take 1 tablet (20 mg total) by mouth daily with supper. 90 tablet 1   No current facility-administered medications for this encounter.    Allergies  Allergen Reactions   Sulfa Antibiotics Hives and Itching   Tetanus Toxoid Anaphylaxis   Aspirin Other (See Comments)    Tinnitus     Social History   Socioeconomic  History   Marital status: Single    Spouse name: Not on file   Number of children: Not on file   Years of education: Not on file   Highest education level: Not on file  Occupational History   Not on file  Tobacco Use   Smoking status: Former    Types: Cigarettes   Smokeless tobacco: Never   Tobacco comments:    Former smoker 09/12/22  Vaping Use   Vaping Use: Never used  Substance and Sexual Activity   Alcohol use: Yes    Alcohol/week: 1.0 standard drink of alcohol    Types: 1 Standard drinks or  equivalent per week    Comment: 1 glass of wine/mixed drink 09/12/22   Drug use: No   Sexual activity: Not Currently  Other Topics Concern   Not on file  Social History Narrative   Not on file   Social Determinants of Health   Financial Resource Strain: Low Risk  (12/17/2021)   Overall Financial Resource Strain (CARDIA)    Difficulty of Paying Living Expenses: Not hard at all  Food Insecurity: No Food Insecurity (12/17/2021)   Hunger Vital Sign    Worried About Running Out of Food in the Last Year: Never true    Ran Out of Food in the Last Year: Never true  Transportation Needs: No Transportation Needs (12/17/2021)   PRAPARE - Administrator, Civil Service (Medical): No    Lack of Transportation (Non-Medical): No  Physical Activity: Sufficiently Active (12/17/2021)   Exercise Vital Sign    Days of Exercise per Week: 3 days    Minutes of Exercise per Session: 60 min  Stress: No Stress Concern Present (12/17/2021)   [employer] of Occupational Health - Occupational Stress Questionnaire    Feeling of Stress : Not at all  Social Connections: Not on file  Intimate Partner Violence: Not on file     ROS- All systems are reviewed and negative except as per the HPI above.  Physical Exam: Vitals:   10/10/22 1442  BP: 138/70  Pulse: 91  Weight: 76 kg  Height: 5\' 4"  (1.626 m)     GEN- The patient is a well appearing elderly female, alert and oriented x 3 today.   HEENT-head normocephalic, atraumatic, sclera clear, conjunctiva pink, hearing intact, trachea midline. Lungs- Clear to ausculation bilaterally, normal work of breathing Heart- irregular rate and rhythm, no murmurs, rubs or gallops  GI- soft, NT, ND, + BS Extremities- no clubbing, cyanosis, or edema MS- no significant deformity or atrophy Skin- no rash or lesion Psych- euthymic mood, full affect Neuro- strength and sensation are intact   Wt Readings from Last 3 Encounters:  10/10/22 76 kg  09/19/22  76.5 kg  09/12/22 76.3 kg    EKG today demonstrates  Afib Vent. rate 91 BPM PR interval * ms QRS duration 86 ms QT/QTcB 340/418 ms  Echo 09/23/22 1. Left ventricular ejection fraction, by estimation, is 60 to 65%. The  left ventricle has normal function. The left ventricle has no regional  wall motion abnormalities. There is moderate left ventricular hypertrophy.  Left ventricular diastolic parameters are indeterminate.   2. Right ventricular systolic function is normal. The right ventricular  size is normal.   3. Left atrial size was moderately dilated.   4. Right atrial size was mildly dilated.   5. The mitral valve is normal in structure. Mild mitral valve  regurgitation. No evidence of mitral stenosis.   6. The  aortic valve is normal in structure. Aortic valve regurgitation is  not visualized. No aortic stenosis is present.   7. The inferior vena cava is normal in size with greater than 50%  respiratory variability, suggesting right atrial pressure of 3 mmHg.    Epic records are reviewed at length today  CHA2DS2-VASc Score = 3  The patient's score is based upon: CHF History: 0 HTN History: 0 Diabetes History: 0 Stroke History: 0 Vascular Disease History: 0 Age Score: 2 Gender Score: 1       ASSESSMENT AND PLAN: 1. Persistent Atrial Fibrillation (ICD10:  I48.19) The patient's CHA2DS2-VASc score is 3, indicating a 3.2% annual risk of stroke.   Did not tolerate metoprolol due to GI side effects or diltiazem with lower extremity swelling.  S/p DCCV on 09/29/22 with quick return of her afib. Rate controlled today.  We discussed rhythm control options including AAD and ablation. She would prefer to avoid long term medication. Will refer to EP to discuss ablation.  Continue bisoprolol 10 mg daily Continue Xarelto 20 mg daily.  2. Secondary Hypercoagulable State (ICD10:  D68.69) The patient is at significant risk for stroke/thromboembolism based upon her CHA2DS2-VASc  Score of 3.  Continue Rivaroxaban (Xarelto).    Follow up with EP to establish care/discuss ablation.    [name] PA-C [facility] [address] [place], [place] [address] [phone] 10/10/2022 3:05 PM

## 2022-10-18 DIAGNOSIS — H524 Presbyopia: Secondary | ICD-10-CM | POA: Diagnosis not present

## 2022-11-09 ENCOUNTER — Encounter: Payer: Self-pay | Admitting: Cardiology

## 2022-11-09 ENCOUNTER — Ambulatory Visit: Payer: Medicare Other | Attending: Cardiology | Admitting: Cardiology

## 2022-11-09 VITALS — BP 114/80 | HR 87 | Ht 64.0 in | Wt 170.0 lb

## 2022-11-09 DIAGNOSIS — I4819 Other persistent atrial fibrillation: Secondary | ICD-10-CM

## 2022-11-09 DIAGNOSIS — D6869 Other thrombophilia: Secondary | ICD-10-CM

## 2022-11-09 NOTE — Progress Notes (Signed)
Electrophysiology Office Note   Date:  11/09/2022   ID:  Alexandria [name], Alexandria [name] 1945/12/12, MRN [id number]  PCP:  Ronnald Nian, MD  Cardiologist:  Lynnette Caffey Primary Electrophysiologist:  Gerardo Caiazzo Alexandria Loa, MD    Chief Complaint: AF   History of Present Illness: Alexandria [name] is a 77 y.o. female who is being seen today for the evaluation of AF at the request of Fenton, Clint R, PA. Presenting today for electrophysiology evaluation.  She has a history significant for atrial fibrillation.  She was incidentally diagnosed February 2024.  She is on metoprolol and Xarelto.  She is post cardioversion 09/29/2022.  She unfortunately went back into atrial fibrillation.    Today, she denies symptoms of palpitations, chest pain, shortness of breath, orthopnea, PND, lower extremity edema, claudication, dizziness, presyncope, syncope, bleeding, or neurologic sequela. The patient is tolerating medications without difficulties.  She is minimally symptomatic from her atrial fibrillation.  She continues to be quite active, doing all of her daily activities.  She has not noted any shortness of breath or fatigue.  After cardioversion, she thinks that she remained in normal rhythm for 6 days.  She may have been mildly less short of breath during that time.   Past Medical History:  Diagnosis Date   Allergy    Asthma, mild intermittent 02/01/2016   Atrial fibrillation [facility])    Past Surgical History:  Procedure Laterality Date   ABDOMINAL HYSTERECTOMY     CARDIOVERSION N/A 09/29/2022   Procedure: CARDIOVERSION;  Surgeon: Christell Constant, MD;  Location: [facility];  Service: Cardiovascular;  Laterality: N/A;     Current Outpatient Medications  Medication Sig Dispense Refill   Ascorbic Acid (VITAMIN C) 1000 MG tablet Take 1,000 mg by mouth daily.     Biotin 5000 MCG CAPS Take 5,000 mcg by mouth daily.     bisoprolol (ZEBETA) 10 MG tablet Take 1 tablet (10 mg total) by mouth daily.  30 tablet 3   Calcium Carb-Cholecalciferol (CALCIUM + VITAMIN D3 PO) Take 1 tablet by mouth in the morning and at bedtime.     Cholecalciferol (VITAMIN D) 125 MCG (5000 UT) CAPS Take 5,000 Units by mouth daily.     estradiol (CLIMARA - DOSED IN MG/24 HR) 0.1 mg/24hr patch Place 0.1 mg onto the skin once a week.     loratadine (CLARITIN) 10 MG tablet Take 10 mg by mouth daily.     losartan (COZAAR) 25 MG tablet Take 0.5 tablets (12.5 mg total) by mouth at bedtime. 45 tablet 3   pyridOXINE (VITAMIN B-6) 100 MG tablet Take 100 mg by mouth daily.     rivaroxaban (XARELTO) 20 MG TABS tablet Take 1 tablet (20 mg total) by mouth daily with supper. 90 tablet 1   No current facility-administered medications for this visit.    Allergies:   Sulfa antibiotics, Tetanus toxoid, and Aspirin   Social History:  The patient  reports that she has quit smoking. Her smoking use included cigarettes. She has never used smokeless tobacco. She reports current alcohol use of about 1.0 standard drink of alcohol per week. She reports that she does not use drugs.   Family History:  The patient's family history is not on file.    ROS:  Please see the history of present illness.   Otherwise, review of systems is positive for none.   All other systems are reviewed and negative.    PHYSICAL EXAM: VS:  BP 114/80  Pulse 87   Ht 5\' 4"  (1.626 m)   Wt 170 lb (77.1 kg)   SpO2 96%   BMI 29.18 kg/m  , BMI Body mass index is 29.18 kg/m. GEN: Well nourished, well developed, in no acute distress  HEENT: normal  Neck: no JVD, carotid bruits, or masses Cardiac: irregular; no murmurs, rubs, or gallops,no edema  Respiratory:  clear to auscultation bilaterally, normal work of breathing GI: soft, nontender, nondistended, + BS MS: no deformity or atrophy  Skin: warm and dry Neuro:  Strength and sensation are intact Psych: euthymic mood, full affect  EKG:  EKG is ordered today. Personal review of the ekg ordered shows  atrial fibrillation  Recent Labs: 06/02/2022: ALT 16 08/10/2022: TSH 1.760 09/19/2022: BUN 14; Creatinine, Ser 0.82; Hemoglobin 16.6; Platelets 251; Potassium 4.2; Sodium 136    Lipid Panel     Component Value Date/Time   CHOL 216 (H) 06/02/2022 1030   TRIG 66 06/02/2022 1030   HDL 49 06/02/2022 1030   CHOLHDL 4.4 06/02/2022 1030   CHOLHDL 4.0 02/01/2016 1113   VLDL 19 02/01/2016 1113   LDLCALC 155 (H) 06/02/2022 1030     Wt Readings from Last 3 Encounters:  11/09/22 170 lb (77.1 kg)  10/10/22 167 lb 9.6 oz (76 kg)  09/19/22 168 lb 9.6 oz (76.5 kg)      Other studies Reviewed: Additional studies/ records that were reviewed today include: TTE 09/25/22  Review of the above records today demonstrates:   1. Left ventricular ejection fraction, by estimation, is 60 to 65%. The  left ventricle has normal function. The left ventricle has no regional  wall motion abnormalities. There is moderate left ventricular hypertrophy.  Left ventricular diastolic  parameters are indeterminate.   2. Right ventricular systolic function is normal. The right ventricular  size is normal.   3. Left atrial size was moderately dilated.   4. Right atrial size was mildly dilated.   5. The mitral valve is normal in structure. Mild mitral valve  regurgitation. No evidence of mitral stenosis.   6. The aortic valve is normal in structure. Aortic valve regurgitation is  not visualized. No aortic stenosis is present.   7. The inferior vena cava is normal in size with greater than 50%  respiratory variability, suggesting right atrial pressure of 3 mmHg.    ASSESSMENT AND PLAN:  1.  Persistent atrial fibrillation: CHA2DS2-VASc of 3.  Currently on Xarelto and bisoprolol.  He remains in atrial fibrillation despite recent cardioversion.  She would like to get back into normal rhythm.  She would prefer to avoid antiarrhythmic medications.  Due to that, we Misako Roeder plan for ablation.  Risk and benefits have been  discussed.  She understands the risks and is agreed to the procedure.  Risk, benefits, and alternatives to EP study and radiofrequency ablation for afib were also discussed in detail today. These risks include but are not limited to stroke, bleeding, vascular damage, tamponade, perforation, damage to the esophagus, lungs, and other structures, pulmonary vein stenosis, worsening renal function, and death. The patient understands these risk and wishes to proceed.  We Natori Gudino therefore proceed with catheter ablation at the next available time.  Carto, ICE, anesthesia are requested for the procedure.  Olson Lucarelli also obtain CT PV protocol prior to the procedure to exclude LAA thrombus and further evaluate atrial anatomy.   2.  Secondary hypercoagulable state: Currently on Xarelto for atrial fibrillation   Current medicines are reviewed at length with the patient  today.   The patient does not have concerns regarding her medicines.  The following changes were made today:  none  Labs/ tests ordered today include:  Orders Placed This Encounter  Procedures   CT CARDIAC MORPH/PULM VEIN W/CM&W/O CA SCORE   EKG 12-Lead     Disposition:   FU with Kenyata Guess 6 months  Signed, Jessia Kief Alexandria Loa, MD  11/09/2022 11:34 AM     [facility] [address] [place] [address] [phone] (office) [phone] (fax)

## 2022-11-09 NOTE — Patient Instructions (Signed)
Medication Instructions:  Your physician recommends that you continue on your current medications as directed. Please refer to the Current Medication list given to you today.  *If you need a refill on your cardiac medications before your next appointment, please call your pharmacy*   Lab Work: Pre procedure labs -- see procedure instruction letter:  BMP & CBC  If you have labs (blood work) drawn today and your tests are completely normal, you will receive your results only by: MyChart Message (if you have MyChart) OR A paper copy in the mail If you have any lab test that is abnormal or we need to change your treatment, we will call you to review the results.   Testing/Procedures: Your physician has requested that you have cardiac CT within 7 days PRIOR to your ablation. Cardiac computed tomography (CT) is a painless test that uses an x-ray machine to take clear, detailed pictures of your heart.  Please follow instruction below located under "other instructions". You will get a call from our office to schedule the date for this test.  Your physician has recommended that you have an ablation. Catheter ablation is a medical procedure used to treat some cardiac arrhythmias (irregular heartbeats). During catheter ablation, a long, thin, flexible tube is put into a blood vessel in your groin (upper thigh), or neck. This tube is called an ablation catheter. It is then guided to your heart through the blood vessel. Radio frequency waves destroy small areas of heart tissue where abnormal heartbeats may cause an arrhythmia to start. Please follow instruction letter given to you today.  You will scheduled for 03/21/2023.  The EP scheduler, April, will be in touch to go over instructions and send those instructions via mychart.   Follow-Up: At [facility], you and your health needs are our priority.  As part of our continuing mission to provide you with exceptional heart care, we have created  designated Provider Care Teams.  These Care Teams include your primary Cardiologist (physician) and Advanced Practice Providers (APPs -  Physician Assistants and Nurse Practitioners) who all work together to provide you with the care you need, when you need it.  Your next appointment:   1 month(s) after your ablation  The format for your next appointment:   In Person  Provider:   AFib clinic   Thank you for choosing [facility]!!   Dory Horn, RN [phone]    Other Instructions   Cardiac Ablation Cardiac ablation is a procedure to destroy (ablate) some heart tissue that is sending bad signals. These bad signals cause problems in heart rhythm. The heart has many areas that make these signals. If there are problems in these areas, they can make the heart beat in a way that is not normal. Destroying some tissues can help make the heart rhythm normal. Tell your doctor about: Any allergies you have. All medicines you are taking. These include vitamins, herbs, eye drops, creams, and over-the-counter medicines. Any problems you or family members have had with medicines that make you fall asleep (anesthetics). Any blood disorders you have. Any surgeries you have had. Any medical conditions you have, such as kidney failure. Whether you are pregnant or may be pregnant. What are the risks? This is a safe procedure. But problems may occur, including: Infection. Bruising and bleeding. Bleeding into the chest. Stroke or blood clots. Damage to nearby areas of your body. Allergies to medicines or dyes. The need for a pacemaker if the normal system is damaged.  Failure of the procedure to treat the problem. What happens before the procedure? Medicines Ask your doctor about: Changing or stopping your normal medicines. This is important. Taking aspirin and ibuprofen. Do not take these medicines unless your doctor tells you to take them. Taking other medicines, vitamins, herbs,  and supplements. General instructions Follow instructions from your doctor about what you cannot eat or drink. Plan to have someone take you home from the hospital or clinic. If you will be going home right after the procedure, plan to have someone with you for 24 hours. Ask your doctor what steps will be taken to prevent infection. What happens during the procedure?  An IV tube will be put into one of your veins. You will be given a medicine to help you relax. The skin on your neck or groin will be numbed. A cut (incision) will be made in your neck or groin. A needle will be put through your cut and into a large vein. A tube (catheter) will be put into the needle. The tube will be moved to your heart. Dye may be put through the tube. This helps your doctor see your heart. Small devices (electrodes) on the tube will send out signals. A type of energy will be used to destroy some heart tissue. The tube will be taken out. Pressure will be held on your cut. This helps stop bleeding. A bandage will be put over your cut. The exact procedure may vary among doctors and hospitals. What happens after the procedure? You will be watched until you leave the hospital or clinic. This includes checking your heart rate, breathing rate, oxygen, and blood pressure. Your cut will be watched for bleeding. You will need to lie still for a few hours. Do not drive for 24 hours or as long as your doctor tells you. Summary Cardiac ablation is a procedure to destroy some heart tissue. This is done to treat heart rhythm problems. Tell your doctor about any medical conditions you may have. Tell him or her about all medicines you are taking to treat them. This is a safe procedure. But problems may occur. These include infection, bruising, bleeding, and damage to nearby areas of your body. Follow what your doctor tells you about food and drink. You may also be told to change or stop some of your medicines. After the  procedure, do not drive for 24 hours or as long as your doctor tells you. This information is not intended to replace advice given to you by your health care provider. Make sure you discuss any questions you have with your health care provider. Document Revised: 08/27/2021 Document Reviewed: 05/09/2019 Elsevier Patient Education  2023 [employer].

## 2022-11-11 ENCOUNTER — Telehealth: Payer: Self-pay | Admitting: Cardiology

## 2022-11-11 NOTE — Telephone Encounter (Signed)
Followed up with pt. Discussed options. Aware that antiarrythmic could be started as temporary means. Pt would like to think about this and will let us know if she would like to do anything prior to ablation. She appreciates my follow up call.

## 2022-11-11 NOTE — Telephone Encounter (Signed)
Pt called in asking to speak to nurse. She would like to discuss all of her other options since her ablation isn't until October.

## 2022-11-15 ENCOUNTER — Telehealth: Payer: Self-pay | Admitting: Cardiology

## 2022-11-15 NOTE — Telephone Encounter (Signed)
Pt would like a callback regarding a letter being faxed to her gym giving permission for her to workout. Pt states that this was discussed previously with provider. Gym name and fax number are below. Please advise  [employer] - [phone]

## 2022-11-17 ENCOUNTER — Encounter: Payer: Self-pay | Admitting: *Deleted

## 2022-11-17 NOTE — Telephone Encounter (Signed)
Pt aware Dr. Elberta Fortis agreeable to requested note/letter. Aware I will get one faxed to them this morning. Pt appreciates the help in this matter.

## 2022-11-17 NOTE — Telephone Encounter (Signed)
Faxed around noon today

## 2022-11-17 NOTE — Telephone Encounter (Signed)
Patient is following up. Please advise when able. 

## 2022-11-22 ENCOUNTER — Ambulatory Visit: Payer: Medicare Other | Admitting: Nurse Practitioner

## 2022-11-22 ENCOUNTER — Ambulatory Visit (INDEPENDENT_AMBULATORY_CARE_PROVIDER_SITE_OTHER): Payer: Medicare Other

## 2022-11-22 VITALS — Ht 64.5 in | Wt 164.0 lb

## 2022-11-22 DIAGNOSIS — Z0000 Encounter for general adult medical examination without abnormal findings: Secondary | ICD-10-CM

## 2022-11-22 NOTE — Patient Instructions (Signed)
Alexandria [name] , Thank you for taking time to come for your Medicare Wellness Visit. I appreciate your ongoing commitment to your health goals. Please review the following plan we discussed and let me know if I can assist you in the future.   These are the goals we discussed:  Goals      Patient Stated     12/17/2021, wants to lose weight     Patient Stated     11/22/2022, wants to lose weight        This is a list of the screening recommended for you and due dates:  Health Maintenance  Topic Date Due   Flu Shot  01/19/2023   Mammogram  08/10/2023   Medicare Annual Wellness Visit  11/22/2023   DEXA scan (bone density measurement)  01/07/2024   Pneumonia Vaccine  Completed   Hepatitis C Screening  Completed   Zoster (Shingles) Vaccine  Completed   HPV Vaccine  Aged Out   DTaP/Tdap/Td vaccine  Discontinued   COVID-19 Vaccine  Discontinued   Cologuard (Stool DNA test)  Discontinued    Advanced directives: copy in chart  Conditions/risks identified: none  Next appointment: Follow up in one year for your annual wellness visit    Preventive Care 65 Years and Older, Female Preventive care refers to lifestyle choices and visits with your health care provider that can promote health and wellness. What does preventive care include? A yearly physical exam. This is also called an annual well check. Dental exams once or twice a year. Routine eye exams. Ask your health care provider how often you should have your eyes checked. Personal lifestyle choices, including: Daily care of your teeth and gums. Regular physical activity. Eating a healthy diet. Avoiding tobacco and drug use. Limiting alcohol use. Practicing safe sex. Taking low-dose aspirin every day. Taking vitamin and mineral supplements as recommended by your health care provider. What happens during an annual well check? The services and screenings done by your health care provider during your annual well check will depend  on your age, overall health, lifestyle risk factors, and family history of disease. Counseling  Your health care provider may ask you questions about your: Alcohol use. Tobacco use. Drug use. Emotional well-being. Home and relationship well-being. Sexual activity. Eating habits. History of falls. Memory and ability to understand (cognition). Work and work Astronomer. Reproductive health. Screening  You may have the following tests or measurements: Height, weight, and BMI. Blood pressure. Lipid and cholesterol levels. These may be checked every 5 years, or more frequently if you are over 14 years old. Skin check. Lung cancer screening. You may have this screening every year starting at age 23 if you have a 30-pack-year history of smoking and currently smoke or have quit within the past 15 years. Fecal occult blood test (FOBT) of the stool. You may have this test every year starting at age 69. Flexible sigmoidoscopy or colonoscopy. You may have a sigmoidoscopy every 5 years or a colonoscopy every 10 years starting at age 60. Hepatitis C blood test. Hepatitis B blood test. Sexually transmitted disease (STD) testing. Diabetes screening. This is done by checking your blood sugar (glucose) after you have not eaten for a while (fasting). You may have this done every 1-3 years. Bone density scan. This is done to screen for osteoporosis. You may have this done starting at age 57. Mammogram. This may be done every 1-2 years. Talk to your health care provider about how often you should have  regular mammograms. Talk with your health care provider about your test results, treatment options, and if necessary, the need for more tests. Vaccines  Your health care provider may recommend certain vaccines, such as: Influenza vaccine. This is recommended every year. Tetanus, diphtheria, and acellular pertussis (Tdap, Td) vaccine. You may need a Td booster every 10 years. Zoster vaccine. You may need  this after age 77. Pneumococcal 13-valent conjugate (PCV13) vaccine. One dose is recommended after age 51. Pneumococcal polysaccharide (PPSV23) vaccine. One dose is recommended after age 86. Talk to your health care provider about which screenings and vaccines you need and how often you need them. This information is not intended to replace advice given to you by your health care provider. Make sure you discuss any questions you have with your health care provider. Document Released: 07/03/2015 Document Revised: 02/24/2016 Document Reviewed: 04/07/2015 Elsevier Interactive Patient Education  2017 [employer].  Fall Prevention in the Home Falls can cause injuries. They can happen to people of all ages. There are many things you can do to make your home safe and to help prevent falls. What can I do on the outside of my home? Regularly fix the edges of walkways and driveways and fix any cracks. Remove anything that might make you trip as you walk through a door, such as a raised step or threshold. Trim any bushes or trees on the path to your home. Use bright outdoor lighting. Clear any walking paths of anything that might make someone trip, such as rocks or tools. Regularly check to see if handrails are loose or broken. Make sure that both sides of any steps have handrails. Any raised decks and porches should have guardrails on the edges. Have any leaves, snow, or ice cleared regularly. Use sand or salt on walking paths during winter. Clean up any spills in your garage right away. This includes oil or grease spills. What can I do in the bathroom? Use night lights. Install grab bars by the toilet and in the tub and shower. Do not use towel bars as grab bars. Use non-skid mats or decals in the tub or shower. If you need to sit down in the shower, use a plastic, non-slip stool. Keep the floor dry. Clean up any water that spills on the floor as soon as it happens. Remove soap buildup in the tub  or shower regularly. Attach bath mats securely with double-sided non-slip rug tape. Do not have throw rugs and other things on the floor that can make you trip. What can I do in the bedroom? Use night lights. Make sure that you have a light by your bed that is easy to reach. Do not use any sheets or blankets that are too big for your bed. They should not hang down onto the floor. Have a firm chair that has side arms. You can use this for support while you get dressed. Do not have throw rugs and other things on the floor that can make you trip. What can I do in the kitchen? Clean up any spills right away. Avoid walking on wet floors. Keep items that you use a lot in easy-to-reach places. If you need to reach something above you, use a strong step stool that has a grab bar. Keep electrical cords out of the way. Do not use floor polish or wax that makes floors slippery. If you must use wax, use non-skid floor wax. Do not have throw rugs and other things on the floor that  can make you trip. What can I do with my stairs? Do not leave any items on the stairs. Make sure that there are handrails on both sides of the stairs and use them. Fix handrails that are broken or loose. Make sure that handrails are as long as the stairways. Check any carpeting to make sure that it is firmly attached to the stairs. Fix any carpet that is loose or worn. Avoid having throw rugs at the top or bottom of the stairs. If you do have throw rugs, attach them to the floor with carpet tape. Make sure that you have a light switch at the top of the stairs and the bottom of the stairs. If you do not have them, ask someone to add them for you. What else can I do to help prevent falls? Wear shoes that: Do not have high heels. Have rubber bottoms. Are comfortable and fit you well. Are closed at the toe. Do not wear sandals. If you use a stepladder: Make sure that it is fully opened. Do not climb a closed stepladder. Make  sure that both sides of the stepladder are locked into place. Ask someone to hold it for you, if possible. Clearly mark and make sure that you can see: Any grab bars or handrails. First and last steps. Where the edge of each step is. Use tools that help you move around (mobility aids) if they are needed. These include: Canes. Walkers. Scooters. Crutches. Turn on the lights when you go into a dark area. Replace any light bulbs as soon as they burn out. Set up your furniture so you have a clear path. Avoid moving your furniture around. If any of your floors are uneven, fix them. If there are any pets around you, be aware of where they are. Review your medicines with your doctor. Some medicines can make you feel dizzy. This can increase your chance of falling. Ask your doctor what other things that you can do to help prevent falls. This information is not intended to replace advice given to you by your health care provider. Make sure you discuss any questions you have with your health care provider. Document Released: 04/02/2009 Document Revised: 11/12/2015 Document Reviewed: 07/11/2014 Elsevier Interactive Patient Education  2017 [employer].

## 2022-11-22 NOTE — Progress Notes (Signed)
I connected with  Alexandria [name] on 11/22/22 by a audio enabled telemedicine application and verified that I am speaking with the correct person using two identifiers.  Patient Location: Home  Provider Location: Office/Clinic  I discussed the limitations of evaluation and management by telemedicine. The patient expressed understanding and agreed to proceed. Subjective:   Alexandria [name] is a 77 y.o. female who presents for Medicare Annual (Subsequent) preventive examination.  Review of Systems     Cardiac Risk Factors include: advanced age (>38men, >23 women);dyslipidemia     Objective:    Today's Vitals   11/22/22 1557  Weight: 164 lb (74.4 kg)  Height: 5' 4.5" (1.638 m)   Body mass index is 27.72 kg/m.     11/22/2022    4:05 PM 12/17/2021    8:53 AM 04/29/2020   10:36 AM 04/25/2019   11:08 AM 04/23/2018   10:55 AM 02/01/2016   11:56 AM  Advanced Directives  Does Patient Have a Medical Advance Directive? Yes Yes Yes No Yes Yes  Type of Estate agent of [employer];Living will Healthcare Power of [place];Living will Healthcare Power of [place];Living will  Healthcare Power of [place];Living will   Does patient want to make changes to medical advance directive?   Yes (Inpatient - patient defers changing a medical advance directive at this time - Information given)  No - Patient declined   Copy of Healthcare Power of Attorney in Chart? Yes - validated most recent copy scanned in chart (See row information) Yes - validated most recent copy scanned in chart (See row information) Yes - validated most recent copy scanned in chart (See row information)  No - copy requested No - copy requested  Would patient like information on creating a medical advance directive?    No - Patient declined      Current Medications (verified) Outpatient Encounter Medications as of 11/22/2022  Medication Sig   Ascorbic Acid (VITAMIN C) 1000 MG tablet Take 1,000 mg by mouth daily.    Biotin 5000 MCG CAPS Take 5,000 mcg by mouth daily.   bisoprolol (ZEBETA) 10 MG tablet Take 1 tablet (10 mg total) by mouth daily.   Calcium Carb-Cholecalciferol (CALCIUM + VITAMIN D3 PO) Take 1 tablet by mouth in the morning and at bedtime.   Cholecalciferol (VITAMIN D) 125 MCG (5000 UT) CAPS Take 5,000 Units by mouth daily.   estradiol (CLIMARA - DOSED IN MG/24 HR) 0.1 mg/24hr patch Place 0.1 mg onto the skin once a week.   loratadine (CLARITIN) 10 MG tablet Take 10 mg by mouth daily.   losartan (COZAAR) 25 MG tablet Take 0.5 tablets (12.5 mg total) by mouth at bedtime.   pyridOXINE (VITAMIN B-6) 100 MG tablet Take 100 mg by mouth daily.   rivaroxaban (XARELTO) 20 MG TABS tablet Take 1 tablet (20 mg total) by mouth daily with supper.   No facility-administered encounter medications on file as of 11/22/2022.    Allergies (verified) Sulfa antibiotics, Tetanus toxoid, and Aspirin   History: Past Medical History:  Diagnosis Date   Allergy    Asthma, mild intermittent 02/01/2016   Atrial fibrillation (HCC)    Past Surgical History:  Procedure Laterality Date   ABDOMINAL HYSTERECTOMY     CARDIOVERSION N/A 09/29/2022   Procedure: CARDIOVERSION;  Surgeon: Christell Constant, MD;  Location: [facility];  Service: Cardiovascular;  Laterality: N/A;   History reviewed. No pertinent family history. Social History   Socioeconomic History   Marital status:  Single    Spouse name: Not on file   Number of children: Not on file   Years of education: Not on file   Highest education level: Not on file  Occupational History   Not on file  Tobacco Use   Smoking status: Former    Types: Cigarettes   Smokeless tobacco: Never   Tobacco comments:    Former smoker 09/12/22  Vaping Use   Vaping Use: Never used  Substance and Sexual Activity   Alcohol use: Yes    Alcohol/week: 1.0 standard drink of alcohol    Types: 1 Standard drinks or equivalent per week    Comment: 1 glass of  wine/mixed drink 09/12/22   Drug use: No   Sexual activity: Not Currently  Other Topics Concern   Not on file  Social History Narrative   Not on file   Social Determinants of Health   Financial Resource Strain: Low Risk  (11/22/2022)   Overall Financial Resource Strain (CARDIA)    Difficulty of Paying Living Expenses: Not hard at all  Food Insecurity: No Food Insecurity (11/22/2022)   Hunger Vital Sign    Worried About Running Out of Food in the Last Year: Never true    Ran Out of Food in the Last Year: Never true  Transportation Needs: No Transportation Needs (11/22/2022)   PRAPARE - Administrator, Civil Service (Medical): No    Lack of Transportation (Non-Medical): No  Physical Activity: Sufficiently Active (11/22/2022)   Exercise Vital Sign    Days of Exercise per Week: 3 days    Minutes of Exercise per Session: 60 min  Stress: Stress Concern Present (11/22/2022)   [employer] of Occupational Health - Occupational Stress Questionnaire    Feeling of Stress : To some extent  Social Connections: Not on file    Tobacco Counseling Counseling given: Not Answered Tobacco comments: Former smoker 09/12/22   Clinical Intake:  Pre-visit preparation completed: Yes  Pain : No/denies pain     Nutritional Status: BMI 25 -29 Overweight Nutritional Risks: None Diabetes: No  How often do you need to have someone help you when you read instructions, pamphlets, or other written materials from your doctor or pharmacy?: 1 - Never  Diabetic? no  Interpreter Needed?: No  Information entered by :: NAllen LPN   Activities of Daily Living    11/22/2022    4:08 PM 09/29/2022   10:55 AM  In your present state of health, do you have any difficulty performing the following activities:  Hearing? 0 0  Vision? 0 0  Difficulty concentrating or making decisions? 0 0  Walking or climbing stairs? 0 0  Dressing or bathing? 0 0  Doing errands, shopping? 0   Preparing Food and  eating ? N   Using the Toilet? N   In the past six months, have you accidently leaked urine? N   Do you have problems with loss of bowel control? N   Managing your Medications? N   Managing your Finances? N   Housekeeping or managing your Housekeeping? N     Patient Care Team: Ronnald Nian, MD as PCP - General (Family Medicine) Orbie Pyo, MD as PCP - Cardiology (Cardiology) Regan Lemming, MD as PCP - Electrophysiology (Cardiology)  Indicate any recent Medical Services you may have received from other than [facility] providers in the past year (date may be approximate).     Assessment:   This is a routine wellness examination  for [place].  Hearing/Vision screen Vision Screening - Comments:: Regular eye exams, [place] [facility]  Dietary issues and exercise activities discussed: Current Exercise Habits: Home exercise routine, Type of exercise: strength training/weights;Other - see comments (stationary bike), Time (Minutes): 60, Frequency (Times/Week): 3, Weekly Exercise (Minutes/Week): 180   Goals Addressed             This Visit's Progress    Patient Stated       11/22/2022, wants to lose weight       Depression Screen    11/22/2022    4:06 PM 06/02/2022    9:44 AM 12/17/2021    8:53 AM 05/03/2021   11:05 AM 04/29/2020   10:37 AM 04/25/2019   10:41 AM 04/23/2018   10:24 AM  PHQ 2/9 Scores  PHQ - 2 Score 0 0 0 0 1 0 0  PHQ- 9 Score 1  1        Fall Risk    11/22/2022    4:06 PM 08/15/2022    1:27 PM 06/02/2022    9:43 AM 12/17/2021    8:53 AM 04/29/2020   10:36 AM  Fall Risk   Falls in the past year? 0 0 0 0 0  Number falls in past yr: 0 0 0 0   Injury with Fall? 0 0 0 0   Risk for fall due to : Medication side effect No Fall Risks No Fall Risks Medication side effect No Fall Risks  Follow up Falls prevention discussed;Education provided;Falls evaluation completed Falls evaluation completed Falls evaluation completed Falls evaluation completed;Education  provided;Falls prevention discussed     FALL RISK PREVENTION PERTAINING TO THE HOME:  Any stairs in or around the home? Yes  If so, are there any without handrails? No  Home free of loose throw rugs in walkways, pet beds, electrical cords, etc? Yes  Adequate lighting in your home to reduce risk of falls? Yes   ASSISTIVE DEVICES UTILIZED TO PREVENT FALLS:  Life alert? No  Use of a cane, walker or w/c? No  Grab bars in the bathroom? Yes  Shower chair or bench in shower? No  Elevated toilet seat or a handicapped toilet? No   TIMED UP AND GO:  Was the test performed? No .      Cognitive Function:        11/22/2022    4:11 PM 12/17/2021    8:56 AM  6CIT Screen  What Year? 0 points 0 points  What month? 0 points 3 points  What time? 0 points 0 points  Count back from 20 0 points 0 points  Months in reverse 0 points 0 points  Repeat phrase 0 points 2 points  Total Score 0 points 5 points    Immunizations Immunization History  Administered Date(s) Administered   Fluad Quad(high Dose 65+) 03/20/2022   Influenza, High Dose Seasonal PF 04/06/2014, 03/30/2015, 03/19/2018, 03/23/2020, 04/27/2021   Influenza,inj,Quad PF,6+ Mos 03/15/2016   Influenza-Unspecified 04/11/2018, 03/15/2019   PFIZER(Purple Top)SARS-COV-2 Vaccination 07/09/2019, 07/29/2019, 04/29/2020   Pneumococcal Conjugate-13 02/01/2016   Pneumococcal Polysaccharide-23 04/23/2018   Pneumococcal-Unspecified 04/11/2018   Zoster Recombinat (Shingrix) 03/02/2019, 05/06/2019    TDAP status: allergy  Flu Vaccine status: Up to date  Pneumococcal vaccine status: Up to date  Covid-19 vaccine status: Completed vaccines  Qualifies for Shingles Vaccine? Yes   Zostavax completed Yes   Shingrix Completed?: Yes  Screening Tests Health Maintenance  Topic Date Due   Medicare Annual Wellness (AWV)  12/18/2022   INFLUENZA  VACCINE  01/19/2023   MAMMOGRAM  08/10/2023   DEXA SCAN  01/07/2024   Pneumonia Vaccine 36+  Years old  Completed   Hepatitis C Screening  Completed   Zoster Vaccines- Shingrix  Completed   HPV VACCINES  Aged Out   DTaP/Tdap/Td  Discontinued   COVID-19 Vaccine  Discontinued   Fecal DNA (Cologuard)  Discontinued    Health Maintenance  Health Maintenance Due  Topic Date Due   Medicare Annual Wellness (AWV)  12/18/2022    Colorectal cancer screening: Type of screening: Cologuard. Completed 06/21/2022. Repeat every 3 years  Mammogram status: Completed 08/09/2022. Repeat every year  Bone Density status: Completed 01/07/2019.   Lung Cancer Screening: (Low Dose CT Chest recommended if Age 93-80 years, 30 pack-year currently smoking OR have quit w/in 15years.) does not qualify.   Lung Cancer Screening Referral: no  Additional Screening:  Hepatitis C Screening: does qualify; Completed 02/01/2016  Vision Screening: Recommended annual ophthalmology exams for early detection of glaucoma and other disorders of the eye. Is the patient up to date with their annual eye exam?  Yes  Who is the provider or what is the name of the office in which the patient attends annual eye exams? [facility] If pt is not established with a provider, would they like to be referred to a provider to establish care? No .   Dental Screening: Recommended annual dental exams for proper oral hygiene  Community Resource Referral / Chronic Care Management: CRR required this visit?  No   CCM required this visit?  No      Plan:     I have personally reviewed and noted the following in the patient's chart:   Medical and social history Use of alcohol, tobacco or illicit drugs  Current medications and supplements including opioid prescriptions. Patient is not currently taking opioid prescriptions. Functional ability and status Nutritional status Physical activity Advanced directives List of other physicians Hospitalizations, surgeries, and ER visits in previous 12 months Vitals Screenings to  include cognitive, depression, and falls Referrals and appointments  In addition, I have reviewed and discussed with patient certain preventive protocols, quality metrics, and best practice recommendations. A written personalized care plan for preventive services as well as general preventive health recommendations were provided to patient.     Barb Merino, LPN   06/28/1476   Nurse Notes: none  Due to this being a virtual visit, the after visit summary with patients personalized plan was offered to patient via mail or my-chart. to pick up at office at next visit

## 2022-11-23 ENCOUNTER — Ambulatory Visit: Payer: Medicare Other | Admitting: Nurse Practitioner

## 2022-12-05 ENCOUNTER — Other Ambulatory Visit: Payer: Self-pay | Admitting: Medical

## 2022-12-07 ENCOUNTER — Telehealth: Payer: Self-pay | Admitting: Family Medicine

## 2022-12-07 ENCOUNTER — Other Ambulatory Visit: Payer: Self-pay

## 2022-12-07 MED ORDER — FLUTICASONE PROPIONATE 50 MCG/ACT NA SUSP: 2.0000 | Freq: Every day | NASAL | 1 refills | Status: DC

## 2022-12-07 NOTE — Telephone Encounter (Signed)
Pt called she needs refill [employer]

## 2022-12-28 ENCOUNTER — Telehealth: Payer: Self-pay | Admitting: Cardiology

## 2022-12-28 NOTE — Telephone Encounter (Signed)
Patient reports she is on a wait list for earlier ablation.  She is calling to see if ablation can be done sooner.  Patient aware Roanna Raider will contact her when she returns to the office

## 2022-12-28 NOTE — Telephone Encounter (Signed)
Patient is requesting a call back from [employer], [place]. Patient did not go into detail.

## 2022-12-29 NOTE — Telephone Encounter (Signed)
LM with pt letting her know that at this time we do not have any sooner dates available for her procedure.

## 2023-01-21 ENCOUNTER — Other Ambulatory Visit (HOSPITAL_COMMUNITY): Payer: Self-pay | Admitting: Physician Assistant

## 2023-02-13 ENCOUNTER — Telehealth: Payer: Self-pay

## 2023-02-13 DIAGNOSIS — I4819 Other persistent atrial fibrillation: Secondary | ICD-10-CM

## 2023-02-13 NOTE — Telephone Encounter (Signed)
Pt is scheduled for an Afib Ablation with Dr. Elberta Fortis on 10/1. CT on 9/24 at [facility]. Labs @ 10:00 at [facility] office  Instruction letters will be mailed to pt per her request.

## 2023-02-22 DIAGNOSIS — Z6829 Body mass index (BMI) 29.0-29.9, adult: Secondary | ICD-10-CM | POA: Diagnosis not present

## 2023-02-22 DIAGNOSIS — Z01419 Encounter for gynecological examination (general) (routine) without abnormal findings: Secondary | ICD-10-CM | POA: Diagnosis not present

## 2023-03-07 ENCOUNTER — Encounter: Payer: Self-pay | Admitting: *Deleted

## 2023-03-07 ENCOUNTER — Ambulatory Visit: Payer: Medicare Other | Attending: Cardiovascular Disease

## 2023-03-07 DIAGNOSIS — I4819 Other persistent atrial fibrillation: Secondary | ICD-10-CM | POA: Diagnosis not present

## 2023-03-09 ENCOUNTER — Encounter: Payer: Self-pay | Admitting: Family Medicine

## 2023-03-09 ENCOUNTER — Ambulatory Visit (INDEPENDENT_AMBULATORY_CARE_PROVIDER_SITE_OTHER): Payer: Medicare Other | Admitting: Family Medicine

## 2023-03-09 VITALS — BP 114/72 | HR 80 | Ht 64.0 in | Wt 166.6 lb

## 2023-03-09 DIAGNOSIS — H6123 Impacted cerumen, bilateral: Secondary | ICD-10-CM

## 2023-03-09 DIAGNOSIS — I4819 Other persistent atrial fibrillation: Secondary | ICD-10-CM

## 2023-03-09 NOTE — Patient Instructions (Addendum)
I recommend getting the flu shot in early October.

## 2023-03-09 NOTE — Progress Notes (Signed)
Chief Complaint  Patient presents with   Ear Fullness    Patient states she has ear fullness, right feels worse than left. Feels like her ears are ringing.     A week or so ago, after she got out of the shower, she noticed plugging, hasn't been able to unplug the ear. She even tried using hair dryer, but still has decreased hearing (couldn't hear the choir master last night), R>L. Ringing is in both ears. She has similar symptoms due to cerumen in the past.  Cerumen was last removed from both ears in 07/2022. She was also diagnosed with atrial fibrillation at that time. She is scheduled for an ablation.  PMH, PSH, SH reviewed  Outpatient Encounter Medications as of 03/09/2023  Medication Sig Note   Ascorbic Acid (VITAMIN C) 1000 MG tablet Take 1,000 mg by mouth daily.    Biotin 5000 MCG CAPS Take 5,000 mcg by mouth daily.    bisoprolol (ZEBETA) 10 MG tablet Take 1 tablet by mouth once daily    Calcium Carb-Cholecalciferol (CALCIUM + VITAMIN D3 PO) Take 1 tablet by mouth in the morning and at bedtime.    Cholecalciferol (VITAMIN D) 125 MCG (5000 UT) CAPS Take 5,000 Units by mouth daily.    estradiol (CLIMARA - DOSED IN MG/24 HR) 0.1 mg/24hr patch Place 0.1 mg onto the skin once a week.    loratadine (CLARITIN) 10 MG tablet Take 10 mg by mouth daily.    losartan (COZAAR) 25 MG tablet Take 0.5 tablets (12.5 mg total) by mouth at bedtime.    pyridOXINE (VITAMIN B-6) 100 MG tablet Take 100 mg by mouth daily.    rivaroxaban (XARELTO) 20 MG TABS tablet Take 1 tablet (20 mg total) by mouth daily with supper.    fluticasone (FLONASE) 50 MCG/ACT nasal spray Place 2 sprays into both nostrils daily. (Patient not taking: Reported on 03/09/2023) 03/09/2023: As needed   No facility-administered encounter medications on file as of 03/09/2023.    ROS: no fever, chills, URI symptoms, ear pain. +hearing loss and ringing per HPI. No chest pain, palpitations, headaches, dizziness, shortness of breath,  edema. No bleeding or rashes. See HPI   PHYSICAL EXAM:  BP 114/72   Pulse 80   Ht 5\' 4"  (1.626 m)   Wt 166 lb 9.6 oz (75.6 kg)   BMI 28.60 kg/m   Talkative, pleasant female in good spirits HEENT: conjunctiva and sclera are clear, EOMI.   Bilateral cerumen impaction on initial evaluation. After ear lavage by nurse, TM's and EACs completely clear, and normal. OP clear. Nose--no drainage, no sinus tenderness Neck: no lymphadenopathy or mass Heart: irregularly irregular, no murmur Lungs: clear bilaterally Extremities: no edema    ASSESSMENT/PLAN:  Hearing loss of both ears due to cerumen impaction - hearing improved after removal of wax today. Return prn for recurrences  Persistent atrial fibrillation (HCC) - asymptomatic. Scheduled for ablation.  She had RSV vaccine 9/17. She will not get COVID booster. Recommended that she get her yearly flu shot in 2 weeks (she plans to get mid-October)  Last CPE was 05/2022 with Minna Merritts, but looks like she is actually [facility] patient. Due for CPE 05/2023, needs to schedule.

## 2023-03-14 ENCOUNTER — Ambulatory Visit (HOSPITAL_COMMUNITY)
Admission: RE | Admit: 2023-03-14 | Discharge: 2023-03-14 | Disposition: A | Discharge status: Home / Self Care | Payer: Medicare Other | Source: Ambulatory Visit | Attending: Cardiology | Admitting: Cardiology

## 2023-03-14 DIAGNOSIS — I4819 Other persistent atrial fibrillation: Secondary | ICD-10-CM | POA: Insufficient documentation

## 2023-03-14 MED ORDER — IOHEXOL 350 MG/ML SOLN
95.0000 mL | Freq: Once | INTRAVENOUS | Status: AC | PRN
  Administered 2023-03-14: 95 mL via INTRAVENOUS

## 2023-03-15 ENCOUNTER — Telehealth: Payer: Self-pay | Admitting: Cardiology

## 2023-03-15 NOTE — Telephone Encounter (Signed)
Patient calling about her results, patient doesn't used mychart. Please advise

## 2023-03-15 NOTE — Telephone Encounter (Signed)
Pt aware that radiologist has not read yet.  Aware that our part for the afib ablation has been looked at by cardiologist, but we are waiting for the rest of the read by radiologist. Aware that we will not call her w/ normal findings and that no news is good news. Patient verbalized understanding and agreeable to plan.

## 2023-03-17 ENCOUNTER — Telehealth: Payer: Self-pay | Admitting: Cardiology

## 2023-03-17 NOTE — Telephone Encounter (Signed)
Patient is calling because she needs to be registered for her procedure on 10/01. Please advise.

## 2023-03-17 NOTE — Telephone Encounter (Signed)
Returned a call back to the pt.  She states someone from pre-registration at the hospital by the name of Irene Limbo has been trying to reach her about her upcoming scheduled afib ablation on 10/1, and pt has been unable answer the phone when she calls,  and is wandering if there is some way we could connect her with Kizzy.  Called over to pre-registration and spoke with Kizzy and informed her that the pt has been trying to return her call and is anxious if she doesn't speak with her, her ablation may get cancelled.   Per [employer], she said she will give her a call back in a few mins to handle things on her end with pre-registering her for her procedure.   Called the pt back and made her aware to expect Kizzy's call back in the next few mins, and be by the phone.   Pt verbalized understanding and was gracious for all the assistance provided.

## 2023-03-18 ENCOUNTER — Other Ambulatory Visit: Payer: Self-pay | Admitting: Internal Medicine

## 2023-03-18 DIAGNOSIS — I4891 Unspecified atrial fibrillation: Secondary | ICD-10-CM

## 2023-03-20 NOTE — Pre-Procedure Instructions (Signed)
Instructed patient on the following items: Arrival time 1100 Nothing to eat or drink after midnight No meds AM of procedure Responsible person to drive you home and stay with you for 24 hrs  Have you missed any doses of anti-coagulant Xarelto- takes once a day, hasn't missed any doses.

## 2023-03-20 NOTE — Telephone Encounter (Signed)
Prescription refill request for Xarelto received.  Indication:afib Last office visit:5/24 Weight:75.6  kg Age:77 Scr:0.79  9/24 CrCl:71.17  ml/min  Prescription refilled

## 2023-03-21 ENCOUNTER — Other Ambulatory Visit: Payer: Self-pay

## 2023-03-21 ENCOUNTER — Encounter (HOSPITAL_COMMUNITY)
Admission: RE | Disposition: A | Discharge status: Home / Self Care | Payer: Medicare Other | Source: Home / Self Care | Attending: Cardiology

## 2023-03-21 ENCOUNTER — Ambulatory Visit (HOSPITAL_COMMUNITY): Payer: Medicare Other

## 2023-03-21 ENCOUNTER — Ambulatory Visit (HOSPITAL_BASED_OUTPATIENT_CLINIC_OR_DEPARTMENT_OTHER): Payer: Medicare Other

## 2023-03-21 ENCOUNTER — Ambulatory Visit (HOSPITAL_COMMUNITY)
Admission: RE | Admit: 2023-03-21 | Discharge: 2023-03-21 | Disposition: A | Discharge status: Home / Self Care | Payer: Medicare Other | Attending: Cardiology | Admitting: Cardiology

## 2023-03-21 DIAGNOSIS — I4819 Other persistent atrial fibrillation: Secondary | ICD-10-CM

## 2023-03-21 DIAGNOSIS — I483 Typical atrial flutter: Secondary | ICD-10-CM

## 2023-03-21 DIAGNOSIS — Z7901 Long term (current) use of anticoagulants: Secondary | ICD-10-CM | POA: Diagnosis not present

## 2023-03-21 DIAGNOSIS — Z79899 Other long term (current) drug therapy: Secondary | ICD-10-CM | POA: Diagnosis not present

## 2023-03-21 HISTORY — PX: ATRIAL FIBRILLATION ABLATION: EP1191

## 2023-03-21 LAB — POCT ACTIVATED CLOTTING TIME
Activated Clotting Time: 415 s
Activated Clotting Time: 336 s

## 2023-03-21 SURGERY — ATRIAL FIBRILLATION ABLATION
Anesthesia: General

## 2023-03-21 MED ORDER — SODIUM CHLORIDE 0.9 % IV SOLN: 250.0000 mL | INTRAVENOUS | Status: DC | PRN

## 2023-03-21 MED ORDER — NITROGLYCERIN 1 MG/10 ML FOR IR/CATH LAB
INTRA_ARTERIAL | Status: AC
  Filled 2023-03-21: qty 10

## 2023-03-21 MED ORDER — PROPOFOL 10 MG/ML IV BOLUS
INTRAVENOUS | Status: DC | PRN
  Administered 2023-03-21: 100 mg via INTRAVENOUS

## 2023-03-21 MED ORDER — FENTANYL CITRATE (PF) 250 MCG/5ML IJ SOLN
INTRAMUSCULAR | Status: DC | PRN
  Administered 2023-03-21 (×2): 25 ug via INTRAVENOUS
  Administered 2023-03-21: 50 ug via INTRAVENOUS

## 2023-03-21 MED ORDER — GLYCOPYRROLATE 0.2 MG/ML IJ SOLN
INTRAMUSCULAR | Status: DC | PRN
Start: 2023-03-21 — End: 2023-03-21
  Administered 2023-03-21: .2 mg via INTRAVENOUS

## 2023-03-21 MED ORDER — NITROGLYCERIN 1 MG/10 ML FOR IR/CATH LAB
INTRA_ARTERIAL | Status: DC | PRN
  Administered 2023-03-21: 200 ug via INTRAVENOUS

## 2023-03-21 MED ORDER — ROCURONIUM BROMIDE 10 MG/ML (PF) SYRINGE
PREFILLED_SYRINGE | INTRAVENOUS | Status: DC | PRN
  Administered 2023-03-21: 60 mg via INTRAVENOUS
  Administered 2023-03-21: 20 mg via INTRAVENOUS

## 2023-03-21 MED ORDER — PROPOFOL 500 MG/50ML IV EMUL
INTRAVENOUS | Status: DC | PRN
  Administered 2023-03-21: 50 ug/kg/min via INTRAVENOUS

## 2023-03-21 MED ORDER — HEPARIN SODIUM (PORCINE) 1000 UNIT/ML IJ SOLN
INTRAMUSCULAR | Status: DC | PRN
Start: 2023-03-21 — End: 2023-03-21
  Administered 2023-03-21: 14000 [IU] via INTRAVENOUS

## 2023-03-21 MED ORDER — HEPARIN (PORCINE) IN NACL 1000-0.9 UT/500ML-% IV SOLN
INTRAVENOUS | Status: DC | PRN
  Administered 2023-03-21 (×4): 500 mL

## 2023-03-21 MED ORDER — PROTAMINE SULFATE 10 MG/ML IV SOLN
INTRAVENOUS | Status: DC | PRN
Start: 2023-03-21 — End: 2023-03-21
  Administered 2023-03-21: 40 mg via INTRAVENOUS

## 2023-03-21 MED ORDER — ONDANSETRON HCL 4 MG/2ML IJ SOLN
INTRAMUSCULAR | Status: DC | PRN
  Administered 2023-03-21: 4 mg via INTRAVENOUS

## 2023-03-21 MED ORDER — ACETAMINOPHEN 325 MG PO TABS: 650.0000 mg | ORAL_TABLET | ORAL | Status: DC | PRN

## 2023-03-21 MED ORDER — ATROPINE SULFATE 1 MG/ML IV SOLN
INTRAVENOUS | Status: DC | PRN
Start: 2023-03-21 — End: 2023-03-21
  Administered 2023-03-21: 1 mg via INTRAVENOUS

## 2023-03-21 MED ORDER — DEXAMETHASONE SODIUM PHOSPHATE 10 MG/ML IJ SOLN
INTRAMUSCULAR | Status: DC | PRN
  Administered 2023-03-21: 10 mg via INTRAVENOUS

## 2023-03-21 MED ORDER — SUGAMMADEX SODIUM 200 MG/2ML IV SOLN
INTRAVENOUS | Status: DC | PRN
  Administered 2023-03-21: 200 mg via INTRAVENOUS

## 2023-03-21 MED ORDER — PHENYLEPHRINE HCL-NACL 20-0.9 MG/250ML-% IV SOLN
INTRAVENOUS | Status: DC | PRN
Start: 2023-03-21 — End: 2023-03-21
  Administered 2023-03-21: 30 ug/min via INTRAVENOUS

## 2023-03-21 MED ORDER — SODIUM CHLORIDE 0.9% FLUSH: 3.0000 mL | INTRAVENOUS | Status: DC | PRN

## 2023-03-21 MED ORDER — LIDOCAINE 2% (20 MG/ML) 5 ML SYRINGE
INTRAMUSCULAR | Status: DC | PRN
  Administered 2023-03-21: 100 mg via INTRAVENOUS

## 2023-03-21 MED ORDER — SODIUM CHLORIDE 0.9 % IV SOLN: INTRAVENOUS | Status: DC

## 2023-03-21 MED ORDER — ONDANSETRON HCL 4 MG/2ML IJ SOLN: 4.0000 mg | Freq: Four times a day (QID) | INTRAMUSCULAR | Status: DC | PRN

## 2023-03-21 SURGICAL SUPPLY — 22 items
BAG SNAP BAND KOVER 36X36 (MISCELLANEOUS) IMPLANT
BLANKET WARM UNDERBOD FULL ACC (MISCELLANEOUS) ×2 IMPLANT
CABLE PFA RX CATH CONN (CABLE) IMPLANT
CATH FARAWAVE ABLATION 31 (CATHETERS) IMPLANT
CATH OCTARAY 2.0 F 3-3-3-3-3 (CATHETERS) IMPLANT
CATH SOUNDSTAR ECO 8FR (CATHETERS) IMPLANT
CATH WEBSTER BI DIR CS D-F CRV (CATHETERS) IMPLANT
CLOSURE MYNX CONTROL 6F/7F (Vascular Products) IMPLANT
CLOSURE PERCLOSE PROSTYLE (VASCULAR PRODUCTS) IMPLANT
COVER SWIFTLINK CONNECTOR (BAG) ×2 IMPLANT
DILATOR VESSEL 38 20CM 16FR (INTRODUCER) IMPLANT
GUIDEWIRE INQWIRE 1.5J.035X260 (WIRE) IMPLANT
INQWIRE 1.5J .035X260CM (WIRE) ×1
PACK EP LATEX FREE (CUSTOM PROCEDURE TRAY) ×1
PACK EP LF (CUSTOM PROCEDURE TRAY) ×2 IMPLANT
PAD DEFIB RADIO PHYSIO CONN (PAD) ×2 IMPLANT
PATCH CARTO3 (PAD) IMPLANT
SHEATH FARADRIVE STEERABLE (SHEATH) IMPLANT
SHEATH PINNACLE 8F 10CM (SHEATH) IMPLANT
SHEATH PINNACLE 9F 10CM (SHEATH) IMPLANT
SHEATH PROBE COVER 6X72 (BAG) IMPLANT
SHEATH WIRE KIT BAYLIS SL1 (KITS) IMPLANT

## 2023-03-21 NOTE — Discharge Instructions (Signed)

## 2023-03-21 NOTE — Progress Notes (Signed)
Assessment at 1500 was intended for 1533

## 2023-03-21 NOTE — H&P (Signed)
Electrophysiology Office Note   Date:  03/21/2023   ID:  Alexandria [name], DOB 1945/11/19, MRN [id number]  PCP:  Ronnald Nian, MD  Cardiologist:  Lynnette Caffey Primary Electrophysiologist:  Janae Bonser [name], MD    Chief Complaint: AF   History of Present Illness: Alexandria [name] is a 77 y.o. female who is being seen today for the evaluation of AF at the request of Regan Lemming, MD. Presenting today for electrophysiology evaluation.  She has a history significant for atrial fibrillation.  She was incidentally diagnosed February 2024.  She is on metoprolol and Xarelto.  She is post cardioversion 09/29/2022.  She unfortunately went back into atrial fibrillation.    Today, denies symptoms of palpitations, chest pain, shortness of breath, orthopnea, PND, lower extremity edema, claudication, dizziness, presyncope, syncope, bleeding, or neurologic sequela. The patient is tolerating medications without difficulties. Plan ablation today.    Past Medical History:  Diagnosis Date   Allergy    Asthma, mild intermittent 02/01/2016   Atrial fibrillation [facility])    Past Surgical History:  Procedure Laterality Date   ABDOMINAL HYSTERECTOMY     CARDIOVERSION N/A 09/29/2022   Procedure: CARDIOVERSION;  Surgeon: Christell Constant, MD;  Location: [facility] INVASIVE CV [facility];  Service: Cardiovascular;  Laterality: N/A;     Current Facility-Administered Medications  Medication Dose Route Frequency Provider Last Rate Last Admin   0.9 %  sodium chloride infusion   Intravenous Continuous Kalle Bernath, Andree Coss, MD        Allergies:   Sulfa antibiotics, Tetanus toxoid, and Aspirin   Social History:  The patient  reports that she has quit smoking. Her smoking use included cigarettes. She has never used smokeless tobacco. She reports current alcohol use of about 1.0 standard drink of alcohol per week. She reports that she does not use drugs.   Family History:  The patient's family history is not  on file.   ROS:  Please see the history of present illness.   Otherwise, review of systems is positive for none.   All other systems are reviewed and negative.   PHYSICAL EXAM: VS:  There were no vitals taken for this visit. , BMI There is no height or weight on file to calculate BMI. GEN: Well nourished, well developed, in no acute distress  HEENT: normal  Neck: no JVD, carotid bruits, or masses Cardiac: iRRR; no murmurs, rubs, or gallops,no edema  Respiratory:  clear to auscultation bilaterally, normal work of breathing GI: soft, nontender, nondistended, + BS MS: no deformity or atrophy  Skin: warm and dry Neuro:  Strength and sensation are intact Psych: euthymic mood, full affect   Recent Labs: 06/02/2022: ALT 16 08/10/2022: TSH 1.760 03/07/2023: BUN 21; Creatinine, Ser 0.79; Hemoglobin 16.4; Platelets 248; Potassium 4.9; Sodium 140    Lipid Panel     Component Value Date/Time   CHOL 216 (H) 06/02/2022 1030   TRIG 66 06/02/2022 1030   HDL 49 06/02/2022 1030   CHOLHDL 4.4 06/02/2022 1030   CHOLHDL 4.0 02/01/2016 1113   VLDL 19 02/01/2016 1113   LDLCALC 155 (H) 06/02/2022 1030     Wt Readings from Last 3 Encounters:  03/09/23 75.6 kg  11/22/22 74.4 kg  11/09/22 77.1 kg      Other studies Reviewed: Additional studies/ records that were reviewed today include: TTE 09/25/22  Review of the above records today demonstrates:   1. Left ventricular ejection fraction, by estimation, is 60 to 65%. The  left ventricle  has normal function. The left ventricle has no regional  wall motion abnormalities. There is moderate left ventricular hypertrophy.  Left ventricular diastolic  parameters are indeterminate.   2. Right ventricular systolic function is normal. The right ventricular  size is normal.   3. Left atrial size was moderately dilated.   4. Right atrial size was mildly dilated.   5. The mitral valve is normal in structure. Mild mitral valve  regurgitation. No evidence  of mitral stenosis.   6. The aortic valve is normal in structure. Aortic valve regurgitation is  not visualized. No aortic stenosis is present.   7. The inferior vena cava is normal in size with greater than 50%  respiratory variability, suggesting right atrial pressure of 3 mmHg.    ASSESSMENT AND PLAN:  1.  Persistent atrial fibrillation: Alexandria [name] has presented today for surgery, with the diagnosis of AF.  The various methods of treatment have been discussed with the patient and family. After consideration of risks, benefits and other options for treatment, the patient has consented to  Procedure(s): Catheter ablation as a surgical intervention .  Risks include but not limited to complete heart block, stroke, esophageal damage, nerve damage, bleeding, vascular damage, tamponade, perforation, MI, and death. The patient's history has been reviewed, patient examined, no change in status, stable for surgery.  I have reviewed the patient's chart and labs.  Questions were answered to the patient's satisfaction.    Alexandria Tuccillo Elberta Fortis, MD 03/21/2023 11:36 AM

## 2023-03-21 NOTE — Anesthesia Preprocedure Evaluation (Signed)
Anesthesia Evaluation  Patient identified by MRN, date of birth, ID band Patient awake    Reviewed: Allergy & Precautions, H&P , NPO status , Patient's Chart, lab work & pertinent test results  Airway Mallampati: II  TM Distance: >3 FB Neck ROM: Full    Dental no notable dental hx.    Pulmonary neg pulmonary ROS, former smoker   Pulmonary exam normal breath sounds clear to auscultation       Cardiovascular Normal cardiovascular exam+ dysrhythmias Atrial Fibrillation  Rhythm:Irregular Rate:Normal  1. Left ventricular ejection fraction, by estimation, is 60 to 65%. The  left ventricle has normal function. The left ventricle has no regional  wall motion abnormalities. There is moderate left ventricular hypertrophy.  Left ventricular diastolic  parameters are indeterminate.   2. Right ventricular systolic function is normal. The right ventricular  size is normal.   3. Left atrial size was moderately dilated.   4. Right atrial size was mildly dilated.   5. The mitral valve is normal in structure. Mild mitral valve  regurgitation. No evidence of mitral stenosis.   6. The aortic valve is normal in structure. Aortic valve regurgitation is  not visualized. No aortic stenosis is present.   7. The inferior vena cava is normal in size with greater than 50%  respiratory variability, suggesting right atrial pressure of 3 mmHg     Neuro/Psych negative neurological ROS  negative psych ROS   GI/Hepatic negative GI ROS, Neg liver ROS,,,  Endo/Other  negative endocrine ROS    Renal/GU negative Renal ROS  negative genitourinary   Musculoskeletal negative musculoskeletal ROS (+)    Abdominal   Peds negative pediatric ROS (+)  Hematology negative hematology ROS (+)   Anesthesia Other Findings   Reproductive/Obstetrics negative OB ROS                             Anesthesia Physical Anesthesia Plan  ASA:  3  Anesthesia Plan: General   Post-op Pain Management: Minimal or no pain anticipated   Induction: Intravenous  PONV Risk Score and Plan: 3 and Ondansetron, Dexamethasone and Treatment may vary due to age or medical condition  Airway Management Planned: Oral ETT  Additional Equipment:   Intra-op Plan:   Post-operative Plan: Extubation in OR  Informed Consent: I have reviewed the patients History and Physical, chart, labs and discussed the procedure including the risks, benefits and alternatives for the proposed anesthesia with the patient or authorized representative who has indicated his/her understanding and acceptance.     Dental advisory given  Plan Discussed with: CRNA and Surgeon  Anesthesia Plan Comments:        Anesthesia Quick Evaluation

## 2023-03-21 NOTE — Anesthesia Postprocedure Evaluation (Signed)
Anesthesia Post Note  Patient: Alexandria [name]  Procedure(s) Performed: ATRIAL FIBRILLATION ABLATION     Patient location during evaluation: PACU Anesthesia Type: General Level of consciousness: awake and alert Pain management: pain level controlled Vital Signs Assessment: post-procedure vital signs reviewed and stable Respiratory status: spontaneous breathing, nonlabored ventilation, respiratory function stable and patient connected to nasal cannula oxygen Cardiovascular status: blood pressure returned to baseline and stable Postop Assessment: no apparent nausea or vomiting Anesthetic complications: no  There were no known notable events for this encounter.  Last Vitals:  Vitals:   03/21/23 1505 03/21/23 1510  BP: 98/71 99/70  Pulse: 61 61  Resp: (!) 9 10  Temp:  36.6 C  SpO2: 93% 92%    Last Pain:  Vitals:   03/21/23 1510  TempSrc: Temporal  PainSc:                  [name] S

## 2023-03-21 NOTE — Transfer of Care (Signed)
Immediate Anesthesia Transfer of Care Note  Patient: Alexandria [name]  Procedure(s) Performed: ATRIAL FIBRILLATION ABLATION  Patient Location: PACU and Cath Lab  Anesthesia Type:General  Level of Consciousness: awake  Airway & Oxygen Therapy: Patient Spontanous Breathing  Post-op Assessment: Report given to RN  Post vital signs: Reviewed and stable  Last Vitals:  Vitals Value Taken Time  BP    Temp    Pulse    Resp    SpO2      Last Pain:  Vitals:   03/21/23 1154  TempSrc: Oral  PainSc:          Complications: There were no known notable events for this encounter.

## 2023-03-21 NOTE — Anesthesia Procedure Notes (Signed)
Procedure Name: Intubation Date/Time: 03/21/2023 12:32 PM  Performed by: Vena Austria, CRNAPre-anesthesia Checklist: Patient identified, Emergency Drugs available, Suction available, Patient being monitored and Timeout performed Patient Re-evaluated:Patient Re-evaluated prior to induction Oxygen Delivery Method: Circle system utilized Preoxygenation: Pre-oxygenation with 100% oxygen Induction Type: IV induction Ventilation: Mask ventilation without difficulty Laryngoscope Size: Glidescope and 3 Grade View: Grade I Tube type: Oral Tube size: 7.0 mm Number of attempts: 1 Airway Equipment and Method: Stylet Placement Confirmation: ETT inserted through vocal cords under direct vision, positive ETCO2 and breath sounds checked- equal and bilateral Secured at: 21 cm Tube secured with: Tape Dental Injury: Teeth and Oropharynx as per pre-operative assessment

## 2023-03-22 ENCOUNTER — Encounter (HOSPITAL_COMMUNITY): Payer: Self-pay | Admitting: Cardiology

## 2023-04-04 DIAGNOSIS — L814 Other melanin hyperpigmentation: Secondary | ICD-10-CM | POA: Diagnosis not present

## 2023-04-04 DIAGNOSIS — L821 Other seborrheic keratosis: Secondary | ICD-10-CM | POA: Diagnosis not present

## 2023-04-04 DIAGNOSIS — L570 Actinic keratosis: Secondary | ICD-10-CM | POA: Diagnosis not present

## 2023-04-04 DIAGNOSIS — D1801 Hemangioma of skin and subcutaneous tissue: Secondary | ICD-10-CM | POA: Diagnosis not present

## 2023-04-18 ENCOUNTER — Ambulatory Visit (HOSPITAL_COMMUNITY)
Admission: RE | Admit: 2023-04-18 | Discharge: 2023-04-18 | Disposition: A | Discharge status: Home / Self Care | Payer: Medicare Other | Source: Ambulatory Visit | Attending: Physician Assistant | Admitting: Physician Assistant

## 2023-04-18 ENCOUNTER — Encounter (HOSPITAL_COMMUNITY): Payer: Self-pay | Admitting: Physician Assistant

## 2023-04-18 VITALS — BP 110/74 | HR 53 | Ht 63.0 in | Wt 171.2 lb

## 2023-04-18 DIAGNOSIS — I4819 Other persistent atrial fibrillation: Secondary | ICD-10-CM | POA: Diagnosis not present

## 2023-04-18 DIAGNOSIS — I4892 Unspecified atrial flutter: Secondary | ICD-10-CM | POA: Diagnosis not present

## 2023-04-18 DIAGNOSIS — I4891 Unspecified atrial fibrillation: Secondary | ICD-10-CM | POA: Diagnosis not present

## 2023-04-18 DIAGNOSIS — D6869 Other thrombophilia: Secondary | ICD-10-CM | POA: Insufficient documentation

## 2023-04-18 DIAGNOSIS — I2510 Atherosclerotic heart disease of native coronary artery without angina pectoris: Secondary | ICD-10-CM | POA: Diagnosis not present

## 2023-04-18 DIAGNOSIS — Z7901 Long term (current) use of anticoagulants: Secondary | ICD-10-CM | POA: Diagnosis not present

## 2023-04-18 MED ORDER — BISOPROLOL FUMARATE 10 MG PO TABS: 5.0000 mg | ORAL_TABLET | Freq: Every day | ORAL | Status: DC

## 2023-04-18 NOTE — Progress Notes (Signed)
Primary Care Physician: Ronnald Nian, MD Primary Cardiologist: Dr Lynnette Caffey Primary Electrophysiologist: Dr Elberta Fortis  Referring Physician: Dr Eula Flax is a 77 y.o. female with a history of atrial fibrillation who presents for follow up in the [facility]. The patient was initially diagnosed with atrial fibrillation by her PCP incidentally 07/2022. She was started on Toprol for rate control and Xarelto for a CHADS2VASC score of 3. She was seen by Dr Lynnette Caffey on 08/22/22 who increased her Toprol to 75 mg daily. At her visit 09/12/22, she remained in rapid afib. Her Toprol was changed due to diltiazem due to GI side effects, then changed to bisoprolol due to lower extremity edema.   Patient is s/p DCCV on 09/29/22. Unfortunately, she was back in afib at follow up. She is now s/p afib and flutter ablation with Dr Elberta Fortis on 03/21/23.  On follow up today, patient reports that she has done well since the ablation. She did have some groin site discomfort but that has resolved. No interim symptoms of afib. She has noticed a low HR in the 40s at times associated with fatigue.   Today, she denies symptoms of palpitations, chest pain, shortness of breath, orthopnea, PND, dizziness, presyncope, syncope, snoring, daytime somnolence, bleeding, or neurologic sequela. The patient is tolerating medications without difficulties and is otherwise without complaint today.    Atrial Fibrillation Risk Factors:  she does not have symptoms or diagnosis of sleep apnea. she does not have a history of rheumatic fever. The patient does not have a history of early familial atrial fibrillation or other arrhythmias.   Atrial Fibrillation Management history:  Previous antiarrhythmic drugs: none Previous cardioversions: none Previous ablations: 03/21/23 afib and flutter Anticoagulation history: Xarelto    Past Medical History:  Diagnosis Date   Allergy    Asthma, mild  intermittent 02/01/2016   Atrial fibrillation (HCC)      Current Outpatient Medications  Medication Sig Dispense Refill   acetaminophen (TYLENOL) 500 MG tablet Take 500-1,000 mg by mouth every 6 (six) hours as needed for moderate pain.     Ascorbic Acid (VITAMIN C) 1000 MG tablet Take 1,000 mg by mouth daily.     Biotin 5000 MCG CAPS Take 5,000 mcg by mouth daily.     Calcium Carb-Cholecalciferol (CALCIUM 600 + D PO) Take 1 tablet by mouth daily.     Cholecalciferol (VITAMIN D) 125 MCG (5000 UT) CAPS Take 5,000 Units by mouth daily.     estradiol (CLIMARA - DOSED IN MG/24 HR) 0.1 mg/24hr patch Place 0.1 mg onto the skin once a week.     fluticasone (FLONASE) 50 MCG/ACT nasal spray Place 2 sprays into both nostrils daily. (Patient taking differently: Place 2 sprays into both nostrils daily as needed for allergies.) 16 g 1   hydrocortisone cream 1 % Apply 1 Application topically daily as needed for itching.     loratadine (CLARITIN) 10 MG tablet Take 10 mg by mouth daily.     losartan (COZAAR) 25 MG tablet Take 0.5 tablets (12.5 mg total) by mouth at bedtime. 45 tablet 3   mometasone (ELOCON) 0.1 % cream Apply 1 Application topically daily as needed (itching).     Polyethyl Glycol-Propyl Glycol (LUBRICATING EYE DROPS OP) Place 1 drop into both eyes daily as needed (dry eyes).     pyridOXINE (VITAMIN B-6) 100 MG tablet Take 100 mg by mouth daily.     rivaroxaban (XARELTO) 20 MG TABS  tablet TAKE 1 TABLET BY MOUTH ONCE DAILY WITH SUPPER 90 tablet 1   bisoprolol (ZEBETA) 10 MG tablet Take 0.5 tablets (5 mg total) by mouth daily.     No current facility-administered medications for this encounter.    ROS- All systems are reviewed and negative except as per the HPI above.  Physical Exam: Vitals:   04/18/23 1409  BP: 110/74  Pulse: (!) 53  Weight: 77.7 kg  Height: 5\' 3"  (1.6 m)    GEN: Well nourished, well developed in no acute distress NECK: No JVD; No carotid bruits CARDIAC: Regular  rate and rhythm, no murmurs, rubs, gallops RESPIRATORY:  Clear to auscultation without rales, wheezing or rhonchi  ABDOMEN: Soft, non-tender, non-distended EXTREMITIES:  No edema; No deformity    Wt Readings from Last 3 Encounters:  04/18/23 77.7 kg  03/21/23 73.9 kg  03/09/23 75.6 kg    EKG today demonstrates  SB, PVC Vent. rate 53 BPM PR interval 150 ms QRS duration 88 ms QT/QTcB 468/439 ms   Echo 09/23/22 1. Left ventricular ejection fraction, by estimation, is 60 to 65%. The  left ventricle has normal function. The left ventricle has no regional  wall motion abnormalities. There is moderate left ventricular hypertrophy.  Left ventricular diastolic parameters are indeterminate.   2. Right ventricular systolic function is normal. The right ventricular  size is normal.   3. Left atrial size was moderately dilated.   4. Right atrial size was mildly dilated.   5. The mitral valve is normal in structure. Mild mitral valve  regurgitation. No evidence of mitral stenosis.   6. The aortic valve is normal in structure. Aortic valve regurgitation is  not visualized. No aortic stenosis is present.   7. The inferior vena cava is normal in size with greater than 50%  respiratory variability, suggesting right atrial pressure of 3 mmHg.    Epic records are reviewed at length today  CHA2DS2-VASc Score = 3  The patient's score is based upon: CHF History: 0 HTN History: 0 Diabetes History: 0 Stroke History: 0 Vascular Disease History: 0 Age Score: 2 Gender Score: 1       ASSESSMENT AND PLAN: Persistent Atrial Fibrillation/atrial flutter The patient's CHA2DS2-VASc score is 3, indicating a 3.2% annual risk of stroke.   Did not tolerate metoprolol due to GI side effects or diltiazem with lower extremity swelling.  S/p afib and flutter ablation 03/21/23 Patient appears to be maintaining SR Decrease bisoprolol to 5 mg daily given bradycardia and fatigue.  Continue Xarelto 20 mg  daily with no missed doses for 3 months post ablation.   Secondary Hypercoagulable State (ICD10:  D68.69) The patient is at significant risk for stroke/thromboembolism based upon her CHA2DS2-VASc Score of 3.  Continue Rivaroxaban (Xarelto).   CAD CAC score 76.6 No anginal symptoms   Follow up with Francis Dowse as scheduled.    [name] PA-C [facility] [address] [place], [place] [address] [phone] 04/18/2023 3:51 PM

## 2023-04-18 NOTE — Patient Instructions (Addendum)
Decrease bisoprolol to 5mg  a day

## 2023-05-10 ENCOUNTER — Other Ambulatory Visit (HOSPITAL_COMMUNITY): Payer: Self-pay | Admitting: *Deleted

## 2023-05-10 MED ORDER — BISOPROLOL FUMARATE 5 MG PO TABS: 5.0000 mg | ORAL_TABLET | Freq: Every day | ORAL | 6 refills | Status: DC

## 2023-06-23 NOTE — Progress Notes (Deleted)
  Cardiology Office Note:  .   Date:  06/23/2023  ID:  Velia LITTIE Hock, DOB May 16, 1946, MRN [id number] PCP: Joyce Norleen BROCKS, MD  [facility] Providers Cardiologist:  Lurena MARLA Red, MD Electrophysiologist:  Soyla Gladis Norton, MD {  History of Present Illness: .   Alexandria [name] is a 78 y.o. female w/PMHx of AFib  Referred to Dr. Norton for Afib management/consideration fo ablation, new Afib dx Feb 2024, had ERAF post DCCV. Minimally symptomatic, suspects though she was only in NSR for 6 days or so. Pt preferred to avoid AADs, and planned for ablation  PVI/CTI ablation 03/21/23  Today's visit is scheduled as her post AFib ablation visit  ROS:   *** symptoms *** chads is 3, keep OAC   Arrhythmia/AAD hx AFib incidentally found Feb 2024 PVI/CTI ablation 03/21/23 (RFA)  Studies Reviewed: SABRA    EKG done today and reviewed by myself:  ***   TTE 09/25/22   1. Left ventricular ejection fraction, by estimation, is 60 to 65%. The  left ventricle has normal function. The left ventricle has no regional  wall motion abnormalities. There is moderate left ventricular hypertrophy.  Left ventricular diastolic  parameters are indeterminate.   2. Right ventricular systolic function is normal. The right ventricular  size is normal.   3. Left atrial size was moderately dilated.   4. Right atrial size was mildly dilated.   5. The mitral valve is normal in structure. Mild mitral valve  regurgitation. No evidence of mitral stenosis.   6. The aortic valve is normal in structure. Aortic valve regurgitation is  not visualized. No aortic stenosis is present.   7. The inferior vena cava is normal in size with greater than 50%  respiratory variability, suggesting right atrial pressure of 3 mmHg.    Risk Assessment/Calculations:    Physical Exam:   VS:  There were no vitals taken for this visit.   Wt Readings from Last 3 Encounters:  04/18/23 171 lb 3.2 oz (77.7 kg)  03/21/23 163  lb (73.9 kg)  03/09/23 166 lb 9.6 oz (75.6 kg)    GEN: Well nourished, well developed in no acute distress NECK: No JVD; No carotid bruits CARDIAC: ***RRR, no murmurs, rubs, gallops RESPIRATORY:  *** CTA b/l without rales, wheezing or rhonchi  ABDOMEN: Soft, non-tender, non-distended EXTREMITIES:  No edema; No deformity    ASSESSMENT AND PLAN: .    Persistent AFib CHA2DS2Vasc is 3 (age/gender), on Xarelto , *** appropriately dosed ***  Secondary hypercoagulable state 2/2 AFib     {Are you ordering a CV Procedure (e.g. stress test, cath, DCCV, TEE, etc)?   Press F2        :[id number]}     Dispo: ***  Signed, Charlies Macario Arthur, PA-C

## 2023-06-26 ENCOUNTER — Ambulatory Visit: Payer: Medicare Other | Admitting: Physician Assistant

## 2023-07-03 NOTE — Progress Notes (Signed)
 Electrophysiology Office Note:   Date:  07/04/2023  ID:  Alexandria [name], DOB August 05, 1945, MRN [id number]  Primary Cardiologist: Arun K Thukkani, MD Primary Heart Failure: None Electrophysiologist: Will Gladis Norton, MD      History of Present Illness:   Alexandria [name] is a 78 y.o. female with h/o AF seen today for routine electrophysiology followup.   She was incidentally discovered to have AF by her PCP.  She was started on xarelto  and a beta blocker (see intolerances below).  She was referred to Cardiology and then EP. She subsequently underwent AF/AFL ablation 03/21/23 per Dr. Norton.  Followed up in AF Clinic 04/18/23 and was in NSR.   Since last being seen in our clinic the patient reports doing very well.  She notes she feels good overall.  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 She wonders why she has AF and her twin does not.   She denies chest pain, palpitations, dyspnea, PND, orthopnea, nausea, vomiting, dizziness, syncope, edema, weight gain, or early satiety.   Review of systems complete and found to be negative unless listed in HPI.   EP Information / Studies Reviewed:    EKG is ordered today. Personal review as below.  EKG Interpretation Date/Time:  Tuesday July 04 2023 14:46:04 EST Ventricular Rate:  101 PR Interval:    QRS Duration:  90 QT Interval:  316 QTC Calculation: 409 R Axis:   44  Text Interpretation: Atrial flutter with 2 to 1 block Confirmed by Aniceto Jarvis ([id number]) on 07/04/2023 2:55:12 PM   Studies:  ECHO 09/2022 > LVEF 60-65%, no RWMA, LA mod dilated, RA mildly dilated    Arrhythmia / AAD AF > initially dx ~ 07/2022  DCCV 09/29/22 with ERAF  AF/AFL ablation 03/21/23 AFL seen on EKG 07/04/23   Risk Assessment/Calculations:    CHA2DS2-VASc Score = 3   This indicates a 3.2% annual risk of stroke. The patient's score is based upon: CHF History: 0 HTN  History: 0 Diabetes History: 0 Stroke History: 0 Vascular Disease History: 0 Age Score: 2 Gender Score: 1             Physical Exam:   VS:  BP 126/84   Pulse 97   Ht 5' 3 (1.6 m)   Wt 169 lb 12.8 oz (77 kg)   SpO2 97%   BMI 30.08 kg/m    Wt Readings from Last 3 Encounters:  07/04/23 169 lb 12.8 oz (77 kg)  04/18/23 171 lb 3.2 oz (77.7 kg)  03/21/23 163 lb (73.9 kg)     GEN: Well nourished, well developed in no acute distress NECK: No JVD; No carotid bruits CARDIAC: Irregularly irregular rate and rhythm, no murmurs, rubs, gallops RESPIRATORY:  Clear to auscultation without rales, wheezing or rhonchi  ABDOMEN: Soft, non-tender, non-distended EXTREMITIES:  No edema; No deformity   ASSESSMENT AND PLAN:     Persistent Atrial Fibrillation  Atrial Flutter  CHA2DS2-VASc 3. S/p DCCV 09/2022 with ERAF, AF/AFL ablation 03/21/23. Intolerant of metoprolol  due to GI side effects and cardizem  due to LE swelling.   -EKG with AFL 2:1 block, ventricular rates ~ 100 on EKG, 90's in room  -OAC for stroke prophylaxis  -increase Bisoprolol  back to 10mg  every day for now, hopefully she will tolerate as had questionable hx of swelling or fatigue in past (she was not certain which) -she would not  be a candidate for flecainide due to CAD.  Discussed Tikosyn with her and she currently is not interested in BID dosing of a medication.  In addition, she is not interested currently in DCCV.  Would reserve amiodarone for now. She would like to pursue additional ablation if possible with Dr. Inocencio.   Secondary Hypercoagulable State  -continue Xarelto , dose reviewed and appropriate by 02/2023 CrCl 94mL/min  CAD  Seen on cardiac CT, CAC score of 76.6 / 51% for matched controls / intermediate study -no anginal symptoms    Follow up with Dr. Inocencio  at next available to discuss re-do ablation   Signed, Daphne Barrack, MSN, APRN, NP-C, AGACNP-BC [facility] - Electrophysiology   07/04/2023, 3:55 PM

## 2023-07-04 ENCOUNTER — Ambulatory Visit: Payer: Medicare Other | Attending: Pulmonary Disease | Admitting: Pulmonary Disease

## 2023-07-04 ENCOUNTER — Encounter: Payer: Self-pay | Admitting: Pulmonary Disease

## 2023-07-04 VITALS — BP 126/84 | HR 97 | Ht 63.0 in | Wt 169.8 lb

## 2023-07-04 DIAGNOSIS — D6869 Other thrombophilia: Secondary | ICD-10-CM | POA: Diagnosis not present

## 2023-07-04 DIAGNOSIS — I480 Paroxysmal atrial fibrillation: Secondary | ICD-10-CM | POA: Diagnosis not present

## 2023-07-04 DIAGNOSIS — I4892 Unspecified atrial flutter: Secondary | ICD-10-CM | POA: Diagnosis not present

## 2023-07-04 MED ORDER — BISOPROLOL FUMARATE 10 MG PO TABS: 10.0000 mg | ORAL_TABLET | Freq: Every day | ORAL | 3 refills | Status: DC

## 2023-07-04 NOTE — Patient Instructions (Addendum)
 Medication Instructions:  Your physician has recommended you make the following change in your medication:  Increase your bisoprolol  to 10 mg daily.  Lab Work: None ordered.  If you have labs (blood work) drawn today and your tests are completely normal, you will receive your results only by: MyChart Message (if you have MyChart) OR A paper copy in the mail If you have any lab test that is abnormal or we need to change your treatment, we will call you to review the results.  Testing/Procedures: None ordered.  Follow-Up: At [facility], you and your health needs are our priority.  As part of our continuing mission to provide you with exceptional heart care, we have created designated Provider Care Teams.  These Care Teams include your primary Cardiologist (physician) and Advanced Practice Providers (APPs -  Physician Assistants and Nurse Practitioners) who all work together to provide you with the care you need, when you need it.   Your next appointment:   First available   The format for your next appointment:   In Person  Provider:   Dr Inocencio    Important Information About Sugar

## 2023-07-20 ENCOUNTER — Ambulatory Visit: Payer: Medicare Other | Admitting: Family Medicine

## 2023-07-20 ENCOUNTER — Encounter: Payer: Self-pay | Admitting: Family Medicine

## 2023-07-20 VITALS — BP 118/80 | HR 96 | Ht 63.25 in | Wt 167.6 lb

## 2023-07-20 DIAGNOSIS — J301 Allergic rhinitis due to pollen: Secondary | ICD-10-CM | POA: Diagnosis not present

## 2023-07-20 DIAGNOSIS — Z13820 Encounter for screening for osteoporosis: Secondary | ICD-10-CM

## 2023-07-20 DIAGNOSIS — I4819 Other persistent atrial fibrillation: Secondary | ICD-10-CM | POA: Diagnosis not present

## 2023-07-20 DIAGNOSIS — Z23 Encounter for immunization: Secondary | ICD-10-CM

## 2023-07-20 DIAGNOSIS — D6869 Other thrombophilia: Secondary | ICD-10-CM | POA: Diagnosis not present

## 2023-07-20 DIAGNOSIS — H6123 Impacted cerumen, bilateral: Secondary | ICD-10-CM

## 2023-07-20 DIAGNOSIS — I4891 Unspecified atrial fibrillation: Secondary | ICD-10-CM | POA: Diagnosis not present

## 2023-07-20 DIAGNOSIS — Z79890 Hormone replacement therapy (postmenopausal): Secondary | ICD-10-CM

## 2023-07-20 DIAGNOSIS — Z0000 Encounter for general adult medical examination without abnormal findings: Secondary | ICD-10-CM | POA: Diagnosis not present

## 2023-07-20 DIAGNOSIS — Z13220 Encounter for screening for lipoid disorders: Secondary | ICD-10-CM

## 2023-07-20 DIAGNOSIS — J4520 Mild intermittent asthma, uncomplicated: Secondary | ICD-10-CM | POA: Diagnosis not present

## 2023-07-20 DIAGNOSIS — E785 Hyperlipidemia, unspecified: Secondary | ICD-10-CM

## 2023-07-20 NOTE — Progress Notes (Signed)
Complete physical exam  Patient: Alexandria [name]   DOB: 12/05/1945   78 y.o. Female  MRN: [id number]  Subjective:    Chief Complaint  Patient presents with   Annual Exam    Fasting.     Alexandria [name] is a 78 y.o. female who presents today for a complete physical exam. She reports consuming a general diet. Gym/ health club routine includes light weights, stationary bike, and walking on track . She generally feels well. She reports sleeping poorly. She does have a history of atrial fibs and ablation as well as cardioversion was unsuccessful.  Presently she is on Zebeta.  She does have an appointment see cardiology in the near future.  She is on Xarelto.  Her allergies are under good control.  She does occasionally use her albuterol inhaler especially if she goes outside.  She is on HRT using estradiol and is very happy with this. Most recent fall risk assessment:    07/20/2023    1:42 PM  Fall Risk   Falls in the past year? 0  Number falls in past yr: 0  Injury with Fall? 0     Most recent depression screenings:    07/20/2023    1:42 PM 11/22/2022    4:06 PM  PHQ 2/9 Scores  PHQ - 2 Score 1 0  PHQ- 9 Score  1    Vision:Within last year and Dental: No current dental problems and Last dental visit: September 2024    Patient Care Team: Ronnald Nian, MD as PCP - General (Family Medicine) Orbie Pyo, MD as PCP - Cardiology (Cardiology) Regan Lemming, MD as PCP - Electrophysiology (Cardiology)   Outpatient Medications Prior to Visit  Medication Sig   acetaminophen (TYLENOL) 500 MG tablet Take 500-1,000 mg by mouth every 6 (six) hours as needed for moderate pain.   Ascorbic Acid (VITAMIN C) 1000 MG tablet Take 1,000 mg by mouth daily.   Biotin 5000 MCG CAPS Take 5,000 mcg by mouth daily.   bisoprolol (ZEBETA) 10 MG tablet Take 1 tablet (10 mg total) by mouth daily.   Calcium Carb-Cholecalciferol (CALCIUM 600 + D PO) Take 1 tablet by mouth daily.    Cholecalciferol (VITAMIN D) 125 MCG (5000 UT) CAPS Take 5,000 Units by mouth daily.   estradiol (CLIMARA - DOSED IN MG/24 HR) 0.1 mg/24hr patch Place 0.1 mg onto the skin once a week.   fluticasone (FLONASE) 50 MCG/ACT nasal spray Place 2 sprays into both nostrils daily. (Patient taking differently: Place 2 sprays into both nostrils daily as needed for allergies.)   hydrocortisone cream 1 % Apply 1 Application topically daily as needed for itching.   loratadine (CLARITIN) 10 MG tablet Take 10 mg by mouth daily.   mometasone (ELOCON) 0.1 % cream Apply 1 Application topically daily as needed (itching).   Polyethyl Glycol-Propyl Glycol (LUBRICATING EYE DROPS OP) Place 1 drop into both eyes daily as needed (dry eyes).   pyridOXINE (VITAMIN B-6) 100 MG tablet Take 100 mg by mouth daily.   rivaroxaban (XARELTO) 20 MG TABS tablet TAKE 1 TABLET BY MOUTH ONCE DAILY WITH SUPPER   losartan (COZAAR) 25 MG tablet Take 0.5 tablets (12.5 mg total) by mouth at bedtime. (Patient not taking: Reported on 07/04/2023)   No facility-administered medications prior to visit.    Review of Systems  All other systems reviewed and are negative.  Family and social history as well as health maintenance and immunizations were reviewed.  Objective:      Physical Exam  Alert and in no distress. Tympanic membranes and canals are normal. Pharyngeal area is normal. Neck is supple without adenopathy or thyromegaly. Cardiac exam shows an irregular  rhythm without murmurs or gallops. Lungs are clear to auscultation.       Assessment & Plan:    Routine general medical examination at a health care facility  Seasonal allergic rhinitis due to pollen  Mild intermittent asthma without complication  Atrial fibrillation status post cardioversion [facility]) - Plan: CBC with Differential/Platelet, Comprehensive metabolic panel  Borderline hyperlipidemia  Hypercoagulable state due to persistent atrial fibrillation (HCC) -  Plan: CBC with Differential/Platelet, Comprehensive metabolic panel  Hormone replacement therapy (HRT) - Plan: CBC with Differential/Platelet, Comprehensive metabolic panel  Need for vaccination against Streptococcus pneumoniae - Plan: Pneumococcal conjugate vaccine 20-valent (Prevnar 20)  Screening for lipid disorders - Plan: Lipid panel  Screening for osteoporosis - Plan: DG Bone Density  Immunization History  Administered Date(s) Administered   Fluad Quad(high Dose 65+) 03/20/2022   Influenza, High Dose Seasonal PF 04/06/2014, 03/30/2015, 03/19/2018, 03/23/2020, 04/27/2021, 04/04/2023   Influenza,inj,Quad PF,6+ Mos 03/15/2016   Influenza-Unspecified 04/11/2018, 03/15/2019   PFIZER(Purple Top)SARS-COV-2 Vaccination 07/09/2019, 07/29/2019, 04/29/2020   PNEUMOCOCCAL CONJUGATE-20 07/20/2023   Pneumococcal Conjugate-13 02/01/2016   Pneumococcal Polysaccharide-23 04/23/2018   Pneumococcal-Unspecified 04/11/2018   Respiratory Syncytial Virus Vaccine,Recomb Aduvanted(Arexvy) 03/07/2023   Zoster Recombinant(Shingrix) 03/02/2019, 05/06/2019    Health Maintenance  Topic Date Due   MAMMOGRAM  08/10/2023   Medicare Annual Wellness (AWV)  11/22/2023   DEXA SCAN  01/07/2024   Pneumonia Vaccine 47+ Years old  Completed   INFLUENZA VACCINE  Completed   Hepatitis C Screening  Completed   Zoster Vaccines- Shingrix  Completed   HPV VACCINES  Aged Out   DTaP/Tdap/Td  Discontinued   COVID-19 Vaccine  Discontinued   Fecal DNA (Cologuard)  Discontinued    Discussed the diagnosis of atrial fibs and ablation as well as cardioversion.  Her heart rate is slightly up but I am going to allow cardiology to readjust her Zebeta if needed.  She plans to discuss possible further ablation in the future.  Otherwise she will continue on her present medication regimen.  Again strongly encouraged her to continue on Xarelto..  Problem List Items Addressed This Visit     Allergic rhinitis due to pollen    Asthma, mild intermittent   Atrial fibrillation status post cardioversion (HCC)   Relevant Orders   CBC with Differential/Platelet   Comprehensive metabolic panel   Borderline hyperlipidemia   Hormone replacement therapy (HRT)   Relevant Orders   CBC with Differential/Platelet   Comprehensive metabolic panel   Hypercoagulable state due to persistent atrial fibrillation (HCC)   Relevant Orders   CBC with Differential/Platelet   Comprehensive metabolic panel   Other Visit Diagnoses       Routine general medical examination at a health care facility    -  Primary     Need for vaccination against Streptococcus pneumoniae       Relevant Orders   Pneumococcal conjugate vaccine 20-valent (Prevnar 20) (Completed)     Screening for lipid disorders       Relevant Orders   Lipid panel     Screening for osteoporosis       Relevant Orders   DG Bone Density      Follow-up 1 year.     Sharlot Gowda, MD

## 2023-07-21 LAB — CBC WITH DIFFERENTIAL/PLATELET
Basophils Absolute: 0 10*3/uL (ref 0.0–0.2)
Basos: 1 %
EOS (ABSOLUTE): 0.2 10*3/uL (ref 0.0–0.4)
Eos: 3 %
Hematocrit: 47.6 % — ABNORMAL HIGH (ref 34.0–46.6)
Hemoglobin: 16.3 g/dL — ABNORMAL HIGH (ref 11.1–15.9)
Immature Grans (Abs): 0 10*3/uL (ref 0.0–0.1)
Immature Granulocytes: 0 %
Lymphocytes Absolute: 1.8 10*3/uL (ref 0.7–3.1)
Lymphs: 31 %
MCH: 30.6 pg (ref 26.6–33.0)
MCHC: 34.2 g/dL (ref 31.5–35.7)
MCV: 89 fL (ref 79–97)
Monocytes Absolute: 0.4 10*3/uL (ref 0.1–0.9)
Monocytes: 8 %
Neutrophils Absolute: 3.3 10*3/uL (ref 1.4–7.0)
Neutrophils: 57 %
Platelets: 195 10*3/uL (ref 150–450)
RBC: 5.33 x10E6/uL — ABNORMAL HIGH (ref 3.77–5.28)
RDW: 12.5 % (ref 11.7–15.4)
WBC: 5.6 10*3/uL (ref 3.4–10.8)

## 2023-07-21 LAB — COMPREHENSIVE METABOLIC PANEL
ALT: 22 [IU]/L (ref 0–32)
AST: 30 [IU]/L (ref 0–40)
Albumin: 4.2 g/dL (ref 3.8–4.8)
Alkaline Phosphatase: 64 [IU]/L (ref 44–121)
BUN/Creatinine Ratio: 23 (ref 12–28)
BUN: 19 mg/dL (ref 8–27)
Bilirubin Total: 0.7 mg/dL (ref 0.0–1.2)
CO2: 23 mmol/L (ref 20–29)
Calcium: 9.1 mg/dL (ref 8.7–10.3)
Chloride: 99 mmol/L (ref 96–106)
Creatinine, Ser: 0.81 mg/dL (ref 0.57–1.00)
Globulin, Total: 2.9 g/dL (ref 1.5–4.5)
Glucose: 81 mg/dL (ref 70–99)
Potassium: 4.7 mmol/L (ref 3.5–5.2)
Sodium: 139 mmol/L (ref 134–144)
Total Protein: 7.1 g/dL (ref 6.0–8.5)
eGFR: 75 mL/min/{1.73_m2} (ref >59)

## 2023-07-21 LAB — LIPID PANEL
Chol/HDL Ratio: 4.3 {ratio} (ref 0.0–4.4)
Cholesterol, Total: 239 mg/dL — ABNORMAL HIGH (ref 100–199)
HDL: 56 mg/dL (ref >39)
LDL Chol Calc (NIH): 170 mg/dL — ABNORMAL HIGH (ref 0–99)
Triglycerides: 74 mg/dL (ref 0–149)
VLDL Cholesterol Cal: 13 mg/dL (ref 5–40)

## 2023-08-10 ENCOUNTER — Encounter: Payer: Medicare Other | Admitting: Family Medicine

## 2023-08-15 ENCOUNTER — Telehealth: Payer: Self-pay

## 2023-08-15 DIAGNOSIS — Z1231 Encounter for screening mammogram for malignant neoplasm of breast: Secondary | ICD-10-CM | POA: Diagnosis not present

## 2023-08-15 DIAGNOSIS — I4819 Other persistent atrial fibrillation: Secondary | ICD-10-CM

## 2023-08-15 LAB — HM MAMMOGRAPHY

## 2023-08-15 NOTE — Telephone Encounter (Signed)
 LM for pt to call back to schedule ablation with Dr. Elberta Fortis.

## 2023-08-15 NOTE — Telephone Encounter (Signed)
 Pt returned my call and she is scheduled for Afib Ablation with Dr. Elberta Fortis on 4/10 at 12:30 PM.  She will have labs done on 3/17 at [facility].  Pt is concerned about having another CT due to her having severe itching last time. She had a CT 02/2023 with her last ablation. I will discuss with Dr. Elberta Fortis if she needs another CT - if so, we can pre-medicate.

## 2023-08-23 ENCOUNTER — Other Ambulatory Visit: Payer: Self-pay | Admitting: Internal Medicine

## 2023-08-23 DIAGNOSIS — I4891 Unspecified atrial fibrillation: Secondary | ICD-10-CM

## 2023-08-23 NOTE — Telephone Encounter (Signed)
 Prescription refill request for Xarelto received.  Indication: AF Last office visit:  07/04/23  Janeece Riggers NP Weight: 77kg Age: 78 Scr: 0.81 on 07/20/23  Epic CrCl: 70.70  Based on above findings Xarelto 20mg  daily is the appropriate dose.  Refill approved.

## 2023-08-24 ENCOUNTER — Telehealth: Payer: Self-pay | Admitting: Cardiology

## 2023-08-24 ENCOUNTER — Telehealth: Payer: Self-pay | Admitting: Internal Medicine

## 2023-08-24 NOTE — Telephone Encounter (Signed)
 Copied from CRM [id number]. Topic: General - Transportation >> Aug 24, 2023  3:04 PM [name] wrote: Reason for CRM: Pt is needing a handicapp sticker for her car. She has a-fib and is having heart surgery again. The cardiologist suggested that she contacted her PCP to get a handicapp sticker paperwork so that she can get one.

## 2023-08-24 NOTE — Telephone Encounter (Signed)
 Pt said that she has allergies and struggling w/ SOB when trying to walk, even around the house some.  She would like a handicap placard.   Pt advised to contact her PCP about this.  Aware that rarely we give a placard from an afib/electrical standpoint.  When mentioning her afib she said it was for her allergies, not afib.  Advised her to contact PCP and aware I will follow up if Camnitz would like to order a placard for her. Pt agreeable to plan.

## 2023-08-24 NOTE — Telephone Encounter (Signed)
 Pt called in asking if she can speak to nurse about getting a handicard placard. Please advise.

## 2023-08-25 ENCOUNTER — Telehealth (HOSPITAL_COMMUNITY): Payer: Self-pay

## 2023-08-25 NOTE — Telephone Encounter (Signed)
 Left detailed message that pt needs to reach out to cardiology to make that determination.

## 2023-08-25 NOTE — Telephone Encounter (Signed)
 Call placed to patient to discuss upcoming procedure.   Patient will have a TEE on day of procedure  Labs: 08/28/23   Any recent signs of acute illness or been started on antibiotics? No Any diabetic medications to hold? No Any missed doses of blood thinner? No Advised patient to continue taking ANTICOAGULANT: Xarelto (Rivaroxaban) without missing any doses.  Medication instructions:  On the morning of your procedure DO NOT take any medication., including Xarelto or the procedure may be rescheduled. Nothing to eat or drink after midnight prior to your procedure.  Confirmed patient is scheduled for Atrial Fibrillation Ablation on Thursday, March 13 with Dr. Loman Brooklyn. Instructed patient to arrive at the Main Entrance A at [facility]: [address] [place], [place] [address] and check in at Admitting at 8:00 AM.  Advised of plan to go home the same day and will only stay overnight if medically necessary. You MUST have a responsible adult to drive you home and MUST be with you the first 24 hours after you arrive home or your procedure could be cancelled.  Patient verbalized understanding to all instructions provided and agreed to proceed with procedure.

## 2023-08-28 DIAGNOSIS — I4819 Other persistent atrial fibrillation: Secondary | ICD-10-CM | POA: Diagnosis not present

## 2023-08-29 LAB — BASIC METABOLIC PANEL
BUN/Creatinine Ratio: 29 — ABNORMAL HIGH (ref 12–28)
BUN: 20 mg/dL (ref 8–27)
CO2: 22 mmol/L (ref 20–29)
Calcium: 9.9 mg/dL (ref 8.7–10.3)
Chloride: 103 mmol/L (ref 96–106)
Creatinine, Ser: 0.68 mg/dL (ref 0.57–1.00)
Glucose: 82 mg/dL (ref 70–99)
Potassium: 5 mmol/L (ref 3.5–5.2)
Sodium: 140 mmol/L (ref 134–144)
eGFR: 90 mL/min/{1.73_m2} (ref >59)

## 2023-08-29 LAB — CBC
Hematocrit: 47.5 % — ABNORMAL HIGH (ref 34.0–46.6)
Hemoglobin: 15.6 g/dL (ref 11.1–15.9)
MCH: 30.2 pg (ref 26.6–33.0)
MCHC: 32.8 g/dL (ref 31.5–35.7)
MCV: 92 fL (ref 79–97)
Platelets: 175 10*3/uL (ref 150–450)
RBC: 5.17 x10E6/uL (ref 3.77–5.28)
RDW: 12.8 % (ref 11.7–15.4)
WBC: 6.7 10*3/uL (ref 3.4–10.8)

## 2023-08-30 NOTE — Pre-Procedure Instructions (Signed)
 Instructed patient on the following items: Arrival time 1000 Nothing to eat or drink after midnight No meds AM of procedure Responsible person to drive you home and stay with you for 24 hrs  Have you missed any doses of anti-coagulant Xarelto- takes once a day, hasn't missed any doses.

## 2023-08-31 ENCOUNTER — Encounter (HOSPITAL_COMMUNITY)
Admission: RE | Disposition: A | Discharge status: Home / Self Care | Payer: Self-pay | Source: Home / Self Care | Attending: Cardiology

## 2023-08-31 ENCOUNTER — Ambulatory Visit (HOSPITAL_COMMUNITY): Admission: RE | Admit: 2023-08-31 | Source: Ambulatory Visit | Admitting: Cardiology

## 2023-08-31 ENCOUNTER — Other Ambulatory Visit: Payer: Self-pay

## 2023-08-31 ENCOUNTER — Ambulatory Visit (HOSPITAL_BASED_OUTPATIENT_CLINIC_OR_DEPARTMENT_OTHER)

## 2023-08-31 ENCOUNTER — Ambulatory Visit (HOSPITAL_COMMUNITY)
Admission: RE | Admit: 2023-08-31 | Discharge: 2023-08-31 | Disposition: A | Discharge status: Home / Self Care | Payer: Medicare Other | Attending: Cardiology | Admitting: Cardiology

## 2023-08-31 ENCOUNTER — Ambulatory Visit (HOSPITAL_COMMUNITY)

## 2023-08-31 DIAGNOSIS — I4891 Unspecified atrial fibrillation: Secondary | ICD-10-CM | POA: Diagnosis not present

## 2023-08-31 DIAGNOSIS — I4819 Other persistent atrial fibrillation: Secondary | ICD-10-CM | POA: Insufficient documentation

## 2023-08-31 DIAGNOSIS — Z87891 Personal history of nicotine dependence: Secondary | ICD-10-CM | POA: Insufficient documentation

## 2023-08-31 DIAGNOSIS — J4520 Mild intermittent asthma, uncomplicated: Secondary | ICD-10-CM

## 2023-08-31 DIAGNOSIS — Z7901 Long term (current) use of anticoagulants: Secondary | ICD-10-CM | POA: Diagnosis not present

## 2023-08-31 DIAGNOSIS — Z79899 Other long term (current) drug therapy: Secondary | ICD-10-CM | POA: Insufficient documentation

## 2023-08-31 HISTORY — PX: ATRIAL FIBRILLATION ABLATION: EP1191

## 2023-08-31 LAB — POCT ACTIVATED CLOTTING TIME: Activated Clotting Time: 377 s

## 2023-08-31 SURGERY — ATRIAL FIBRILLATION ABLATION
Anesthesia: General

## 2023-08-31 MED ORDER — PROPOFOL 500 MG/50ML IV EMUL
INTRAVENOUS | Status: DC | PRN
  Administered 2023-08-31: 55 ug/kg/min via INTRAVENOUS

## 2023-08-31 MED ORDER — LIDOCAINE 2% (20 MG/ML) 5 ML SYRINGE
INTRAMUSCULAR | Status: DC | PRN
  Administered 2023-08-31: 40 mg via INTRAVENOUS

## 2023-08-31 MED ORDER — ACETAMINOPHEN 325 MG PO TABS
ORAL_TABLET | ORAL | Status: AC
  Administered 2023-08-31: 650 mg via ORAL
  Filled 2023-08-31: qty 2

## 2023-08-31 MED ORDER — FENTANYL CITRATE (PF) 250 MCG/5ML IJ SOLN
INTRAMUSCULAR | Status: DC | PRN
  Administered 2023-08-31: 50 ug via INTRAVENOUS

## 2023-08-31 MED ORDER — ACETAMINOPHEN 325 MG PO TABS: 650.0000 mg | ORAL_TABLET | ORAL | Status: DC | PRN

## 2023-08-31 MED ORDER — PROTAMINE SULFATE 10 MG/ML IV SOLN
INTRAVENOUS | Status: DC | PRN
  Administered 2023-08-31: 40 mg via INTRAVENOUS

## 2023-08-31 MED ORDER — ROCURONIUM BROMIDE 10 MG/ML (PF) SYRINGE
PREFILLED_SYRINGE | INTRAVENOUS | Status: DC | PRN
  Administered 2023-08-31: 20 mg via INTRAVENOUS
  Administered 2023-08-31: 50 mg via INTRAVENOUS

## 2023-08-31 MED ORDER — PHENYLEPHRINE 80 MCG/ML (10ML) SYRINGE FOR IV PUSH (FOR BLOOD PRESSURE SUPPORT)
PREFILLED_SYRINGE | INTRAVENOUS | Status: DC | PRN
  Administered 2023-08-31 (×2): 80 ug via INTRAVENOUS
  Administered 2023-08-31: 160 ug via INTRAVENOUS

## 2023-08-31 MED ORDER — FENTANYL CITRATE (PF) 100 MCG/2ML IJ SOLN
INTRAMUSCULAR | Status: AC
  Filled 2023-08-31: qty 2

## 2023-08-31 MED ORDER — HEPARIN (PORCINE) IN NACL 1000-0.9 UT/500ML-% IV SOLN
INTRAVENOUS | Status: DC | PRN
  Administered 2023-08-31 (×4): 500 mL

## 2023-08-31 MED ORDER — DEXAMETHASONE SODIUM PHOSPHATE 10 MG/ML IJ SOLN
INTRAMUSCULAR | Status: DC | PRN
  Administered 2023-08-31: 10 mg via INTRAVENOUS

## 2023-08-31 MED ORDER — SODIUM CHLORIDE 0.9% FLUSH: 3.0000 mL | Freq: Two times a day (BID) | INTRAVENOUS | Status: DC

## 2023-08-31 MED ORDER — SUGAMMADEX SODIUM 200 MG/2ML IV SOLN
INTRAVENOUS | Status: DC | PRN
  Administered 2023-08-31: 200 mg via INTRAVENOUS

## 2023-08-31 MED ORDER — ONDANSETRON HCL 4 MG/2ML IJ SOLN
INTRAMUSCULAR | Status: DC | PRN
  Administered 2023-08-31: 4 mg via INTRAVENOUS

## 2023-08-31 MED ORDER — SODIUM CHLORIDE 0.9 % IV SOLN: 250.0000 mL | INTRAVENOUS | Status: DC | PRN

## 2023-08-31 MED ORDER — ONDANSETRON HCL 4 MG/2ML IJ SOLN: 4.0000 mg | Freq: Four times a day (QID) | INTRAMUSCULAR | Status: DC | PRN

## 2023-08-31 MED ORDER — PROPOFOL 10 MG/ML IV BOLUS
INTRAVENOUS | Status: DC | PRN
  Administered 2023-08-31: 50 mg via INTRAVENOUS
  Administered 2023-08-31: 100 mg via INTRAVENOUS
  Administered 2023-08-31: 20 mg via INTRAVENOUS
  Administered 2023-08-31: 30 mg via INTRAVENOUS

## 2023-08-31 MED ORDER — SODIUM CHLORIDE 0.9% FLUSH: 3.0000 mL | INTRAVENOUS | Status: DC | PRN

## 2023-08-31 MED ORDER — HEPARIN SODIUM (PORCINE) 1000 UNIT/ML IJ SOLN
INTRAMUSCULAR | Status: DC | PRN
  Administered 2023-08-31: 14000 [IU] via INTRAVENOUS

## 2023-08-31 MED ORDER — SODIUM CHLORIDE 0.9 % IV SOLN: INTRAVENOUS | Status: DC

## 2023-08-31 MED ORDER — ATROPINE SULFATE 1 MG/ML IV SOLN
INTRAVENOUS | Status: DC | PRN
Start: 2023-08-31 — End: 2023-08-31
  Administered 2023-08-31: 1 mg via INTRAVENOUS

## 2023-08-31 SURGICAL SUPPLY — 21 items
BAG SNAP BAND KOVER 36X36 (MISCELLANEOUS) IMPLANT
CABLE PFA RX CATH CONN (CABLE) IMPLANT
CATH 8FR REPROCESSED SOUNDSTAR (CATHETERS) ×1 IMPLANT
CATH 8FR SOUNDSTAR REPROCESSED (CATHETERS) IMPLANT
CATH FARAWAVE ABLATION 31 (CATHETERS) IMPLANT
CATH OCTARAY 2.0 F 3-3-3-3-3 (CATHETERS) IMPLANT
CATH WEB BI DIR CSDF CRV REPRO (CATHETERS) IMPLANT
CLOSURE MYNX CONTROL 6F/7F (Vascular Products) IMPLANT
CLOSURE PERCLOSE PROSTYLE (VASCULAR PRODUCTS) IMPLANT
COVER SWIFTLINK CONNECTOR (BAG) ×1 IMPLANT
DILATOR VESSEL 38 20CM 16FR (INTRODUCER) IMPLANT
GUIDEWIRE INQWIRE 1.5J.035X260 (WIRE) IMPLANT
INQWIRE 1.5J .035X260CM (WIRE) ×1 IMPLANT
KIT VERSACROSS CNCT FARADRIVE (KITS) IMPLANT
PACK EP LF (CUSTOM PROCEDURE TRAY) ×1 IMPLANT
PAD DEFIB RADIO PHYSIO CONN (PAD) ×1 IMPLANT
PATCH CARTO3 (PAD) IMPLANT
SHEATH FARADRIVE STEERABLE (SHEATH) IMPLANT
SHEATH PINNACLE 8F 10CM (SHEATH) IMPLANT
SHEATH PINNACLE 9F 10CM (SHEATH) IMPLANT
SHEATH PROBE COVER 6X72 (BAG) IMPLANT

## 2023-08-31 NOTE — Discharge Instructions (Signed)

## 2023-08-31 NOTE — Anesthesia Procedure Notes (Signed)
 Procedure Name: Intubation Date/Time: 08/31/2023 12:58 PM  Performed by: Vena Austria, CRNAPre-anesthesia Checklist: Patient identified, Emergency Drugs available, Suction available, Patient being monitored and Timeout performed Patient Re-evaluated:Patient Re-evaluated prior to induction Oxygen Delivery Method: Circle system utilized Preoxygenation: Pre-oxygenation with 100% oxygen Induction Type: IV induction Ventilation: Mask ventilation with difficulty Laryngoscope Size: McGrath and 3 Grade View: Grade I Tube type: Oral Tube size: 7.5 mm Number of attempts: 1 Airway Equipment and Method: Stylet and Video-laryngoscopy Placement Confirmation: ETT inserted through vocal cords under direct vision, positive ETCO2, CO2 detector and breath sounds checked- equal and bilateral Secured at: 22 cm Tube secured with: Tape Dental Injury: Teeth and Oropharynx as per pre-operative assessment

## 2023-08-31 NOTE — Progress Notes (Addendum)
 Patient stated she has never missed any doses of her xarelto .

## 2023-08-31 NOTE — H&P (Signed)
 Electrophysiology Office Note   Date:  08/31/2023   ID:  Alexandria [name], DOB Feb 10, 1946, MRN [id number]  PCP:  Ronnald Nian, MD  Cardiologist:  Lynnette Caffey Primary Electrophysiologist:  Anmol Fleck [name], MD    Chief Complaint: AF   History of Present Illness: Alexandria [name] is a 78 y.o. female who is being seen today for the evaluation of AF at the request of Regan Lemming, MD. Presenting today for electrophysiology evaluation.  She has a history significant for atrial fibrillation.  She was incidentally diagnosed February 2024.  She is on metoprolol and Xarelto.  She is post cardioversion 09/29/2022.  She unfortunately went back into atrial fibrillation.    Today, denies symptoms of palpitations, chest pain, shortness of breath, orthopnea, PND, lower extremity edema, claudication, dizziness, presyncope, syncope, bleeding, or neurologic sequela. The patient is tolerating medications without difficulties. Plan ablation today.    Past Medical History:  Diagnosis Date   Allergy    Asthma, mild intermittent 02/01/2016   Atrial fibrillation (HCC)    Past Surgical History:  Procedure Laterality Date   ABDOMINAL HYSTERECTOMY     ATRIAL FIBRILLATION ABLATION N/A 03/21/2023   Procedure: ATRIAL FIBRILLATION ABLATION;  Surgeon: Regan Lemming, MD;  Location: [facility];  Service: Cardiovascular;  Laterality: N/A;   CARDIOVERSION N/A 09/29/2022   Procedure: CARDIOVERSION;  Surgeon: Christell Constant, MD;  Location: [facility];  Service: Cardiovascular;  Laterality: N/A;     Current Facility-Administered Medications  Medication Dose Route Frequency Provider Last Rate Last Admin   0.9 %  sodium chloride infusion   Intravenous Continuous Vonnie Spagnolo, Andree Coss, MD        Allergies:   Sulfa antibiotics, Tetanus toxoid, and Aspirin   Social History:  The patient  reports that she has quit smoking. Her smoking use included cigarettes. She has never  used smokeless tobacco. She reports current alcohol use of about 1.0 standard drink of alcohol per week. She reports that she does not use drugs.   Family History:  The patient's family history is not on file.   ROS:  Please see the history of present illness.   Otherwise, review of systems is positive for none.   All other systems are reviewed and negative.   PHYSICAL EXAM: VS:  BP 137/86   Pulse (!) 112   Temp 97.7 F (36.5 C)   Resp 18   Ht 5\' 3"  (1.6 m)   Wt 74.4 kg   SpO2 98%   BMI 29.05 kg/m  , BMI Body mass index is 29.05 kg/m. GEN: No acute distress.   Neck: No JVD Cardiac: irregular, no murmurs, rubs, or gallops.  Respiratory: decreased BS bases bilaterally. GI: Soft, nontender, non-distended  MS: No edema; No deformity. Neuro:  Nonfocal  Skin: warm and dry,  Psych: Normal affect     Recent Labs: 07/20/2023: ALT 22 08/28/2023: BUN 20; Creatinine, Ser 0.68; Hemoglobin 15.6; Platelets 175; Potassium 5.0; Sodium 140    Lipid Panel     Component Value Date/Time   CHOL 239 (H) 07/20/2023 1437   TRIG 74 07/20/2023 1437   HDL 56 07/20/2023 1437   CHOLHDL 4.3 07/20/2023 1437   CHOLHDL 4.0 02/01/2016 1113   VLDL 19 02/01/2016 1113   LDLCALC 170 (H) 07/20/2023 1437     Wt Readings from Last 3 Encounters:  08/31/23 74.4 kg  07/20/23 76 kg  07/04/23 77 kg      Other studies Reviewed: Additional  studies/ records that were reviewed today include: TTE 09/25/22  Review of the above records today demonstrates:   1. Left ventricular ejection fraction, by estimation, is 60 to 65%. The  left ventricle has normal function. The left ventricle has no regional  wall motion abnormalities. There is moderate left ventricular hypertrophy.  Left ventricular diastolic  parameters are indeterminate.   2. Right ventricular systolic function is normal. The right ventricular  size is normal.   3. Left atrial size was moderately dilated.   4. Right atrial size was mildly dilated.    5. The mitral valve is normal in structure. Mild mitral valve  regurgitation. No evidence of mitral stenosis.   6. The aortic valve is normal in structure. Aortic valve regurgitation is  not visualized. No aortic stenosis is present.   7. The inferior vena cava is normal in size with greater than 50%  respiratory variability, suggesting right atrial pressure of 3 mmHg.    ASSESSMENT AND PLAN:  1.  Persistent atrial fibrillation: Alexandria [name] has presented today for surgery, with the diagnosis of AF.  The various methods of treatment have been discussed with the patient and family. After consideration of risks, benefits and other options for treatment, the patient has consented to  Procedure(s): Catheter ablation as a surgical intervention .  Risks include but not limited to complete heart block, stroke, esophageal damage, nerve damage, bleeding, vascular damage, tamponade, perforation, MI, and death. The patient's history has been reviewed, patient examined, no change in status, stable for surgery.  I have reviewed the patient's chart and labs.  Questions were answered to the patient's satisfaction.    Terisha Losasso Elberta Fortis, MD 08/31/2023 11:21 AM

## 2023-08-31 NOTE — Anesthesia Postprocedure Evaluation (Signed)
 Anesthesia Post Note  Patient: Alexandria [name]  Procedure(s) Performed: ATRIAL FIBRILLATION ABLATION     Patient location during evaluation: PACU Anesthesia Type: General Level of consciousness: awake and alert Pain management: pain level controlled Vital Signs Assessment: post-procedure vital signs reviewed and stable Respiratory status: spontaneous breathing, nonlabored ventilation, respiratory function stable and patient connected to nasal cannula oxygen Cardiovascular status: blood pressure returned to baseline and stable Postop Assessment: no apparent nausea or vomiting Anesthetic complications: no  There were no known notable events for this encounter.  Last Vitals:  Vitals:   08/31/23 1545 08/31/23 1600  BP: 106/63 (!) 84/72  Pulse: 69 65  Resp:    Temp:    SpO2: 97% 98%    Last Pain:  Vitals:   08/31/23 1520  TempSrc:   PainSc: 0-No pain                 Alexandria [name]

## 2023-08-31 NOTE — Transfer of Care (Signed)
 Immediate Anesthesia Transfer of Care Note  Patient: Alexandria [name]  Procedure(s) Performed: ATRIAL FIBRILLATION ABLATION  Patient Location: PACU and Cath Lab  Anesthesia Type:General  Level of Consciousness: awake  Airway & Oxygen Therapy: Patient connected to nasal cannula oxygen  Post-op Assessment: Post -op Vital signs reviewed and stable  Post vital signs: stable  Last Vitals:  Vitals Value Taken Time  BP    Temp    Pulse    Resp    SpO2      Last Pain:  Vitals:   08/31/23 1032  PainSc: 0-No pain      Patients Stated Pain Goal: 3 (08/31/23 1032)  Complications: There were no known notable events for this encounter.

## 2023-08-31 NOTE — Anesthesia Preprocedure Evaluation (Addendum)
 Anesthesia Evaluation  Patient identified by MRN, date of birth, ID band Patient awake    Reviewed: Allergy & Precautions, NPO status , Patient's Chart, lab work & pertinent test results, reviewed documented beta blocker date and time   Airway Mallampati: II  TM Distance: >3 FB Neck ROM: Full    Dental no notable dental hx. (+) Teeth Intact, Dental Advisory Given   Pulmonary asthma , former smoker   Pulmonary exam normal breath sounds clear to auscultation       Cardiovascular Normal cardiovascular exam+ dysrhythmias (xarelto) Atrial Fibrillation  Rhythm:Regular Rate:Normal  TTE 2024 1. Left ventricular ejection fraction, by estimation, is 60 to 65%. The  left ventricle has normal function. The left ventricle has no regional  wall motion abnormalities. There is moderate left ventricular hypertrophy.  Left ventricular diastolic  parameters are indeterminate.   2. Right ventricular systolic function is normal. The right ventricular  size is normal.   3. Left atrial size was moderately dilated.   4. Right atrial size was mildly dilated.   5. The mitral valve is normal in structure. Mild mitral valve  regurgitation. No evidence of mitral stenosis.   6. The aortic valve is normal in structure. Aortic valve regurgitation is  not visualized. No aortic stenosis is present.   7. The inferior vena cava is normal in size with greater than 50%  respiratory variability, suggesting right atrial pressure of 3 mmHg.     Neuro/Psych negative neurological ROS  negative psych ROS   GI/Hepatic negative GI ROS, Neg liver ROS,,,  Endo/Other  negative endocrine ROS    Renal/GU negative Renal ROS  negative genitourinary   Musculoskeletal negative musculoskeletal ROS (+)    Abdominal   Peds  Hematology negative hematology ROS (+)   Anesthesia Other Findings   Reproductive/Obstetrics                              Anesthesia Physical Anesthesia Plan  ASA: 3  Anesthesia Plan: General   Post-op Pain Management:    Induction: Intravenous  PONV Risk Score and Plan: Midazolam, Dexamethasone and Ondansetron  Airway Management Planned: Oral ETT  Additional Equipment:   Intra-op Plan:   Post-operative Plan: Extubation in OR  Informed Consent: I have reviewed the patients History and Physical, chart, labs and discussed the procedure including the risks, benefits and alternatives for the proposed anesthesia with the patient or authorized representative who has indicated his/her understanding and acceptance.     Dental advisory given  Plan Discussed with: CRNA  Anesthesia Plan Comments:        Anesthesia Quick Evaluation

## 2023-09-01 ENCOUNTER — Telehealth (HOSPITAL_COMMUNITY): Payer: Self-pay

## 2023-09-01 ENCOUNTER — Encounter (HOSPITAL_COMMUNITY): Payer: Self-pay | Admitting: Cardiology

## 2023-09-01 NOTE — Telephone Encounter (Signed)
 Spoke with patient to complete post procedure follow up call.  Patient reports no complications with groin sites.   Instructions reviewed with patient:  Remove large bandage at puncture site after 24 hours. It is normal to have bruising, tenderness and a pea or marble sized lump/knot at the groin site which can take up to three months to resolve.  Get help right away if you notice sudden swelling at the puncture site.  Check your puncture site every day for signs of infection: fever, redness, swelling, pus drainage, warmth, foul odor or excessive pain. If this occurs, please call the office at [phone], to speak with the nurse. Get help right away if your puncture site is bleeding and the bleeding does not stop after applying firm pressure to the area.  You may continue to have skipped beats/ atrial fibrillation during the first several months after your procedure.  It is very important not to miss any doses of your blood thinner Xarelto. Patient restarted taking this medication on yesterday, 08/31/23.   You will follow up with the Afib clinic on 09/28/23 and follow up with Dr. Elberta Fortis on 12/06/23.   Patient verbalized understanding to all instructions provided.

## 2023-09-13 ENCOUNTER — Ambulatory Visit: Payer: Medicare Other | Admitting: Cardiology

## 2023-09-25 DIAGNOSIS — K080 Exfoliation of teeth due to systemic causes: Secondary | ICD-10-CM | POA: Diagnosis not present

## 2023-09-28 ENCOUNTER — Ambulatory Visit (HOSPITAL_COMMUNITY)
Admit: 2023-09-28 | Discharge: 2023-09-28 | Disposition: A | Discharge status: Home / Self Care | Source: Ambulatory Visit | Attending: Internal Medicine | Admitting: Internal Medicine

## 2023-09-28 VITALS — BP 126/70 | HR 45 | Ht 63.0 in | Wt 163.0 lb

## 2023-09-28 DIAGNOSIS — I2510 Atherosclerotic heart disease of native coronary artery without angina pectoris: Secondary | ICD-10-CM | POA: Insufficient documentation

## 2023-09-28 DIAGNOSIS — I4892 Unspecified atrial flutter: Secondary | ICD-10-CM | POA: Diagnosis not present

## 2023-09-28 DIAGNOSIS — D6869 Other thrombophilia: Secondary | ICD-10-CM | POA: Insufficient documentation

## 2023-09-28 DIAGNOSIS — Z79899 Other long term (current) drug therapy: Secondary | ICD-10-CM | POA: Insufficient documentation

## 2023-09-28 DIAGNOSIS — I4891 Unspecified atrial fibrillation: Secondary | ICD-10-CM | POA: Diagnosis not present

## 2023-09-28 DIAGNOSIS — I4819 Other persistent atrial fibrillation: Secondary | ICD-10-CM | POA: Diagnosis not present

## 2023-09-28 DIAGNOSIS — Z7901 Long term (current) use of anticoagulants: Secondary | ICD-10-CM | POA: Insufficient documentation

## 2023-09-28 MED ORDER — BISOPROLOL FUMARATE 5 MG PO TABS: ORAL_TABLET | ORAL | Status: DC

## 2023-09-28 NOTE — Patient Instructions (Signed)
 Decrease Bisoprolol dose- 5 mg - Take 1 tablet once daily

## 2023-09-28 NOTE — Progress Notes (Signed)
 Primary Care Physician: Ronnald Nian, MD Primary Cardiologist: Dr Lynnette Caffey Primary Electrophysiologist: Dr Elberta Fortis  Referring Physician: Dr Eula Flax is a 78 y.o. female with a history of atrial fibrillation who presents for follow up in the [facility]. The patient was initially diagnosed with atrial fibrillation by her PCP incidentally 07/2022. She was started on Toprol for rate control and Xarelto for a CHADS2VASC score of 3. She was seen by Dr Lynnette Caffey on 08/22/22 who increased her Toprol to 75 mg daily. At her visit 09/12/22, she remained in rapid afib. Her Toprol was changed due to diltiazem due to GI side effects, then changed to bisoprolol due to lower extremity edema. Patient is s/p DCCV on 09/29/22. Unfortunately, she was back in afib at follow up. S/p afib and flutter ablation with Dr Elberta Fortis on 03/21/23.  On follow up 09/28/23, patient is currently in NSR. S/p Afib ablation on 08/31/23 by Dr. Elberta Fortis. No episodes of Afib since ablation. Patient feels tired with lower heart rate. No chest pain or SOB. Leg sites healed without issue. No missed doses of Xarelto 20 mg daily.   Today, she denies symptoms of orthopnea, PND, lower extremity edema, dizziness, presyncope, syncope, snoring, daytime somnolence, bleeding, or neurologic sequela. The patient is tolerating medications without difficulties and is otherwise without complaint today.    Atrial Fibrillation Risk Factors:  she does not have symptoms or diagnosis of sleep apnea. she does not have a history of rheumatic fever. The patient does not have a history of early familial atrial fibrillation or other arrhythmias.   Atrial Fibrillation Management history:  Previous antiarrhythmic drugs: none Previous cardioversions: none Previous ablations: 03/21/23 afib and flutter; 08/31/23 Anticoagulation history: Xarelto    Past Medical History:  Diagnosis Date   Allergy    Asthma, mild  intermittent 02/01/2016   Atrial fibrillation (HCC)      Current Outpatient Medications  Medication Sig Dispense Refill   acetaminophen (TYLENOL) 500 MG tablet Take 500-1,000 mg by mouth as needed for moderate pain (pain score 4-6).     Ascorbic Acid (VITAMIN C) 1000 MG tablet Take 1,000 mg by mouth daily.     Biotin 5000 MCG CAPS Take 5,000 mcg by mouth daily.     Calcium Carb-Cholecalciferol (CALCIUM 600 + D PO) Take 1 tablet by mouth daily.     Cholecalciferol (VITAMIN D) 125 MCG (5000 UT) CAPS Take 5,000 Units by mouth daily.     estradiol (CLIMARA - DOSED IN MG/24 HR) 0.1 mg/24hr patch Place 0.1 mg onto the skin once a week.     fluticasone (FLONASE) 50 MCG/ACT nasal spray Place 2 sprays into both nostrils daily. (Patient taking differently: Place 2 sprays into both nostrils daily as needed for allergies.) 16 g 1   hydrocortisone cream 1 % Apply 1 Application topically daily as needed for itching.     loratadine (CLARITIN) 10 MG tablet Take 10 mg by mouth daily.     mometasone (ELOCON) 0.1 % cream Apply 1 Application topically daily as needed (itching).     Polyethyl Glycol-Propyl Glycol (LUBRICATING EYE DROPS OP) Place 1 drop into both eyes 4 (four) times daily as needed (dry eyes).     pyridOXINE (VITAMIN B-6) 100 MG tablet Take 100 mg by mouth daily.     rivaroxaban (XARELTO) 20 MG TABS tablet TAKE 1 TABLET BY MOUTH ONCE DAILY WITH SUPPER 90 tablet 1   bisoprolol (ZEBETA) 5 MG tablet Take  1 tablet by mouth daily     No current facility-administered medications for this encounter.    ROS- All systems are reviewed and negative except as per the HPI above.  Physical Exam: Vitals:   09/28/23 1426  BP: 126/70  Pulse: (!) 45  Weight: 73.9 kg  Height: 5\' 3"  (1.6 m)    GEN- The patient is well appearing, alert and oriented x 3 today.   Neck - no JVD or carotid bruit noted Lungs- Clear to ausculation bilaterally, normal work of breathing Heart- Regular bradycardic rate and  rhythm, no murmurs, rubs or gallops, PMI not laterally displaced Extremities- no clubbing, cyanosis, or edema Skin - no rash or ecchymosis noted   Wt Readings from Last 3 Encounters:  09/28/23 73.9 kg  08/31/23 74.4 kg  07/20/23 76 kg    EKG today demonstrates  Vent. rate 45 BPM PR interval 158 ms QRS duration 78 ms QT/QTcB 456/394 ms P-R-T axes 59 71 40 Sinus bradycardia Otherwise normal ECG When compared with ECG of 31-Aug-2023 14:40, PREVIOUS ECG IS PRESENT   Echo 09/23/22 1. Left ventricular ejection fraction, by estimation, is 60 to 65%. The  left ventricle has normal function. The left ventricle has no regional  wall motion abnormalities. There is moderate left ventricular hypertrophy.  Left ventricular diastolic parameters are indeterminate.   2. Right ventricular systolic function is normal. The right ventricular  size is normal.   3. Left atrial size was moderately dilated.   4. Right atrial size was mildly dilated.   5. The mitral valve is normal in structure. Mild mitral valve  regurgitation. No evidence of mitral stenosis.   6. The aortic valve is normal in structure. Aortic valve regurgitation is  not visualized. No aortic stenosis is present.   7. The inferior vena cava is normal in size with greater than 50%  respiratory variability, suggesting right atrial pressure of 3 mmHg.    Epic records are reviewed at length today  CHA2DS2-VASc Score = 3  The patient's score is based upon: CHF History: 0 HTN History: 0 Diabetes History: 0 Stroke History: 0 Vascular Disease History: 0 Age Score: 2 Gender Score: 1       ASSESSMENT AND PLAN: Persistent Atrial Fibrillation/atrial flutter The patient's CHA2DS2-VASc score is 3, indicating a 3.2% annual risk of stroke.   Did not tolerate metoprolol due to GI side effects or diltiazem with lower extremity swelling.  S/p afib and flutter ablation 03/21/23. S/p Afib ablation on 08/31/23 by Dr. Elberta Fortis.  She is  currently in NSR. Decrease bisoprolol to 5 mg daily due to bradycardia and fatigue.   Secondary Hypercoagulable State (ICD10:  D68.69) The patient is at significant risk for stroke/thromboembolism based upon her CHA2DS2-VASc Score of 3.  Continue Rivaroxaban (Xarelto).  No missed doses.   CAD CAC score 76.6 No chest pain.   Follow up with Francis Dowse as scheduled.    Justin Mend, PA-C Af[facility] Clinic [facility] [address] [place], [place] [address] [phone] 09/28/2023 3:27 PM

## 2023-10-07 DIAGNOSIS — S0096XA Insect bite (nonvenomous) of unspecified part of head, initial encounter: Secondary | ICD-10-CM | POA: Diagnosis not present

## 2023-10-07 DIAGNOSIS — W57XXXA Bitten or stung by nonvenomous insect and other nonvenomous arthropods, initial encounter: Secondary | ICD-10-CM | POA: Diagnosis not present

## 2023-10-18 ENCOUNTER — Encounter: Payer: Self-pay | Admitting: Family Medicine

## 2023-10-18 ENCOUNTER — Ambulatory Visit: Admitting: Family Medicine

## 2023-10-18 ENCOUNTER — Ambulatory Visit: Payer: Self-pay | Admitting: *Deleted

## 2023-10-18 VITALS — BP 118/60 | HR 60 | Temp 97.2°F | Ht 63.5 in | Wt 165.8 lb

## 2023-10-18 DIAGNOSIS — S1096XD Insect bite of unspecified part of neck, subsequent encounter: Secondary | ICD-10-CM

## 2023-10-18 DIAGNOSIS — R519 Headache, unspecified: Secondary | ICD-10-CM | POA: Diagnosis not present

## 2023-10-18 DIAGNOSIS — W57XXXD Bitten or stung by nonvenomous insect and other nonvenomous arthropods, subsequent encounter: Secondary | ICD-10-CM | POA: Diagnosis not present

## 2023-10-18 NOTE — Patient Instructions (Signed)
 The "bite" on your neck seems to be healing very well. There is no evidence for any ongoing infection. Complete the full course of the antibiotic. The tenderness at the back of your head is not related to an infection. Might be more related to the muscles that insert onto the skull. You can heat vs ice--use whichever feels best (vs alternating heat and ice if they both feel good). You can try using the voltaren  gel to that area. You can also use tylenol  as needed for the pain, and continue the tizanidine at night, since this is helping some. Do not take ibuprofen.  Return for re-evaluation if you notice any neck stiffness, fever, worsening headaches, or other concerns.

## 2023-10-18 NOTE — Telephone Encounter (Signed)
 Copied from CRM [id number]. Topic: Clinical - Red Word Triage >> Oct 18, 2023  8:27 AM Alexandria [name] wrote: Red Word that prompted transfer to Nurse Triage: Bit by spider Reason for Disposition  [1] Red or very tender (to touch) area AND [2] started over 24 hours after the bite  Answer Assessment - Initial Assessment Questions 1. TYPE of INSECT: "What type of insect was it?"      I was bit by a spider.  I don't know what kind.   It was Sat. Before Easter.   I thought it was a mosquito bite.    2. ONSET: "When did you get bitten?"      On the back of my neck is raised and I had a headache.   I went to urgent care.   I'm still having sensitive.  3. LOCATION: "Where is the insect bite located?"      Back of neck 4. REDNESS: "Is the area red or pink?" If Yes, ask: "What size is area of redness?" (inches or cm). "When did the redness start?"     I would like to see Dr. Lalaonde.   Did not want to be triaged. 5. PAIN: "Is there any pain?" If Yes, ask: "How bad is it?"  (Scale 1-10; or mild, moderate, severe)      6. ITCHING: "Does it itch?" If Yes, ask: "How bad is the itch?"    - MILD: doesn't interfere with normal activities   - MODERATE-SEVERE: interferes with work, school, sleep, or other activities      refused 7. SWELLING: "How big is the swelling?" (inches, cm, or compare to coins)     Pink 8. OTHER SYMPTOMS: "Do you have any other symptoms?"  (e.g., difficulty breathing, hives)     Happened 9. PREGNANCY: "Is there any chance you are pregnant?" "When was your last menstrual period?"     N/A  Protocols used: Insect Bite-A-AH  Chief Complaint: bitten by a spider on the back of her neck Sat. Before Easter.  Seen at urgent care but it is still tender.  Wants to see Dr. Robina Chol.   "I just want an appt to have it looked at".    "I don't understand why I have to talk with a nurse just for an appt".    I explained it was to help with getting the right help and appt.   Pt. Was very upset with all the  questions and the time it was taking to "just make an appt to see my dr".   Symptoms: It's sensitive and tender.   She originally had a raised place and a headache.   Don't know what kind of spider bit her.   Frequency: Sat. Before Easter Pertinent Negatives: Patient denies she refused triage beyond telling me what is above.   "I just want an appt".    Disposition: [] ED /[] Urgent Care (no appt availability in office) / [x] Appointment(In office/virtual)/ []  [place] [facility]/ [] Home Care/ [] Refused Recommended Disposition /[] [place] [facility]/ []  Follow-up with PCP Additional Notes: Appt made for today at 1:30 with Dr. Monnie Anthony.

## 2023-10-18 NOTE — Progress Notes (Signed)
 Chief Complaint  Patient presents with   Insect Bite    Spider bite about 2 weeks ago. Seen at [facility].    Patient presents for follow-up on insect bite to her neck. She had been working in the yard on Monday 4/14. She saw a red spot on the back of her R neck, thought it was a mosquito bite. It wasn't itchy or painful. That night when she laid down, she had a lot of pain at the back of her head.  She took ibuprofen (wasn't supposed to, on blood thinners), and put some toothpaste on it.   As the week progressed, she noticed more sensitivity on the back of her head and neck. Once it started to hurt to turn her head to the right, she went to [facility] on [facility].  She is still taking the doxycycline  that was prescribed.  She is turning her head better, the swelling has gone down. She still feels a sensitive knot on the back of her head, hurts to touch, brush hair. The bump on the neck no longer hurts. She is taking the tizanidine at night--helps her sleep. Not using it during the day. She has been using diclofenac  gel to the spot on the neck, not to the painful spot on the scalp area.     PMH, PSH, SH reviewed  Allergies, asthma, afib  Outpatient Encounter Medications as of 10/18/2023  Medication Sig Note   Ascorbic Acid (VITAMIN C) 1000 MG tablet Take 1,000 mg by mouth daily.    Biotin 5000 MCG CAPS Take 5,000 mcg by mouth daily.    bisoprolol  (ZEBETA ) 5 MG tablet Take 1 tablet by mouth daily    Calcium Carb-Cholecalciferol (CALCIUM 600 + D PO) Take 1 tablet by mouth daily.    Cholecalciferol (VITAMIN D) 125 MCG (5000 UT) CAPS Take 5,000 Units by mouth daily.    doxycycline  (VIBRA -TABS) 100 MG tablet Take 100 mg by mouth 2 (two) times daily.    estradiol (CLIMARA - DOSED IN MG/24 HR) 0.1 mg/24hr patch Place 0.1 mg onto the skin once a week.    fluticasone  (FLONASE ) 50 MCG/ACT nasal spray Place 2 sprays into both nostrils daily. (Patient taking differently: Place 2 sprays into both nostrils daily as needed  for allergies.)    loratadine (CLARITIN) 10 MG tablet Take 10 mg by mouth daily.    Polyethyl Glycol-Propyl Glycol (LUBRICATING EYE DROPS OP) Place 1 drop into both eyes 4 (four) times daily as needed (dry eyes).    Probiotic Product (PROBIOTIC DAILY PO) Take 1 capsule by mouth daily.    pyridOXINE (VITAMIN B-6) 100 MG tablet Take 100 mg by mouth daily.    rivaroxaban  (XARELTO ) 20 MG TABS tablet TAKE 1 TABLET BY MOUTH ONCE DAILY WITH SUPPER    acetaminophen  (TYLENOL ) 500 MG tablet Take 500-1,000 mg by mouth as needed for moderate pain (pain score 4-6). (Patient not taking: Reported on 10/18/2023) 10/18/2023: As needed   hydrocortisone cream 1 % Apply 1 Application topically daily as needed for itching. (Patient not taking: Reported on 10/18/2023) 10/18/2023: As needed   mometasone  (ELOCON ) 0.1 % cream Apply 1 Application topically daily as needed (itching). (Patient not taking: Reported on 10/18/2023) 10/18/2023: As needed   No facility-administered encounter medications on file as of 10/18/2023.   Allergies  Allergen Reactions   Sulfa Antibiotics Hives and Itching   Tetanus Toxoid Anaphylaxis   Aspirin Other (See Comments)    Tinnitus     ROS: no f/c, n/v/d. No  other skin concerns. No URI symptoms. Denies HA, just the tenderness on the back of her head. No CP, palpitations, bleeding or bruising.    PHYSICAL EXAM:  BP 118/60   Pulse 60   Temp (!) 97.2 F (36.2 C) (Tympanic)   Ht 5' 3.5" (1.613 m)   Wt 165 lb 12.8 oz (75.2 kg)   BMI 28.91 kg/m   Pleasant, well-appearing female, in no distress HEENT: conjunctiva and sclera are clear, EOMI Neck: no lymphadenopathy, thyromegaly or mass. No spinal tenderness. Pink papule 5mm at the right posterior neck. No central pore, induration, warmth. This is nontender. She is tender at the right occiput.  No skin abnormality noted in area of discomfort.  No soft tissue swelling or asymmetry, just tender to touch. No erythema. No posterior  cervical lymphadenopathy. FROM of neck Heart: regular rate and rhythm Lungs: clear bilaterally Psych: normal mood, affect, hygiene and grooming Neuro: alert and oriented, cranial nerves grossly intact, normal gait.   ASSESSMENT/PLAN:  Occipital pain - suspect MSK; trial ice/heat/stretches, diclofenac  gel or tylenol  as needed. Reassured  Insect bite of neck, subsequent encounter - Reassured that any infection that may have been present was adequately treated with doxycycline , to complete the course  The "bite" on your neck seems to be healing very well. There is no evidence for any ongoing infection. Complete the full course of the antibiotic. The tenderness at the back of your head is not related to an infection. Might be more related to the muscles that insert onto the skull. You can heat vs ice--use whichever feels best (vs alternating heat and ice if they both feel good). You can try using the voltaren  gel to that area. You can also use tylenol  as needed for the pain, and continue the tizanidine at night, since this is helping some.

## 2023-11-08 ENCOUNTER — Encounter: Payer: Self-pay | Admitting: Family Medicine

## 2023-11-08 ENCOUNTER — Ambulatory Visit: Payer: Self-pay

## 2023-11-08 ENCOUNTER — Ambulatory Visit (INDEPENDENT_AMBULATORY_CARE_PROVIDER_SITE_OTHER): Admitting: Family Medicine

## 2023-11-08 VITALS — BP 126/68 | HR 62 | Temp 97.8°F | Wt 166.6 lb

## 2023-11-08 DIAGNOSIS — J029 Acute pharyngitis, unspecified: Secondary | ICD-10-CM

## 2023-11-08 LAB — POCT RAPID STREP A (OFFICE): Rapid Strep A Screen: NEGATIVE

## 2023-11-08 NOTE — Progress Notes (Signed)
   Subjective:    Patient ID: Alexandria [name], female    DOB: 02-Jan-1946, 78 y.o.   MRN: [id number]  HPI She complains of a 5-day history of sore throat but no fever, chills, cough, congestion, sneezing, itchy watery eyes.   Review of Systems     Objective:    Physical Exam Alert and in no distress. Tympanic membranes and canals are normal. Pharyngeal area is normal. Neck is supple without adenopathy or thyromegaly. Cardiac exam shows a regular sinus rhythm without murmurs or gallops. Lungs are clear to auscultation. Strep test is negative       Assessment & Plan:  Sore throat - Plan: Rapid Strep A I explained that she has a viral pharyngitis and supportive care.  Tylenol , gargling, call if continued

## 2023-11-08 NOTE — Telephone Encounter (Signed)
  Chief Complaint: sore throat Symptoms: red sore throat with white streaks, nasal congestion, occasional cough, fatigue Frequency: x 2.5 days Pertinent Negatives: Patient denies fever, difficulty breathing, rash, ear aches Disposition: [] ED /[] Urgent Care (no appt availability in office) / [x] Appointment(In office/virtual)/ []  [place] [facility]/ [] Home Care/ [] Refused Recommended Disposition /[] [place] [facility]/ []  Follow-up with PCP Additional Notes: Patient states this does not feel allergy related. She took 2 Tylenol  last night and ibuprofen. She states she was able to eat bananas and honey and drink warm liquids. She states she has also been using Flonase  and taking Loratadine. Patient agreeable to acute visit tomorrow, she states she was wanting to be fit in today due to there [facility] is broken and the repair man is supposed to come today or tomorrow. Advised patient call back for new or worsening symptoms.  Copied from CRM [id number]. Topic: Clinical - Red Word Triage >> Nov 08, 2023 12:00 PM Yolanda T wrote: [facility] that prompted transfer to Nurse Triage: patient called said she has a sore throat and nasal drainage. Please f/u with patient Reason for Disposition  [1] Sore throat is the only symptom AND [2] present > 48 hours  Answer Assessment - Initial Assessment Questions 1. ONSET: "When did the throat start hurting?" (Hours or days ago)      Monday night, 11/06/23.  2. SEVERITY: "How bad is the sore throat?" (Scale 1-10; mild, moderate or severe)   - MILD (1-3):  Doesn't interfere with eating or normal activities.   - MODERATE (4-7): Interferes with eating some solids and normal activities.   - SEVERE (8-10):  Excruciating pain, interferes with most normal activities.   - SEVERE WITH DYSPHAGIA (10): Can't swallow liquids, drooling.     Moderate.  3. STREP EXPOSURE: "Has there been any exposure to strep within the past week?" If Yes, ask: "What type of contact occurred?"       No.  4.  VIRAL SYMPTOMS: "Are there any symptoms of a cold, such as a runny nose, cough, hoarse voice or red eyes?"      Nasal congestion, occasional cough.  5. FEVER: "Do you have a fever?" If Yes, ask: "What is your temperature, how was it measured, and when did it start?"     Denies.  6. PUS ON THE TONSILS: "Is there pus on the tonsils in the back of your throat?"     Redness. She states she doesn't have tonsils, but states she has white streaks at the back of her throat.  7. OTHER SYMPTOMS: "Do you have any other symptoms?" (e.g., difficulty breathing, headache, rash)     Fatigue.  8. PREGNANCY: "Is there any chance you are pregnant?" "When was your last menstrual period?"     N/A.  Protocols used: Sore Throat-A-AH

## 2023-11-09 ENCOUNTER — Ambulatory Visit: Admitting: Medical

## 2023-11-28 ENCOUNTER — Ambulatory Visit: Payer: Medicare Other

## 2023-11-28 DIAGNOSIS — Z0000 Encounter for general adult medical examination without abnormal findings: Secondary | ICD-10-CM

## 2023-11-28 NOTE — Progress Notes (Signed)
 Subjective:   Alexandria [name] is a 78 y.o. who presents for a Medicare Wellness preventive visit.  As a reminder, Annual Wellness Visits don't include a physical exam, and some assessments may be limited, especially if this visit is performed virtually. We may recommend an in-person follow-up visit with your provider if needed.  Visit Complete: Virtual I connected with  Alexandria [name] on 11/28/23 by a audio enabled telemedicine application and verified that I am speaking with the correct person using two identifiers.  Patient Location: Home  Provider Location: Office/Clinic  I discussed the limitations of evaluation and management by telemedicine. The patient expressed understanding and agreed to proceed.  Vital Signs: Because this visit was a virtual/telehealth visit, some criteria may be missing or patient reported. Any vitals not documented were not able to be obtained and vitals that have been documented are patient reported.  VideoError- Librarian, academic were attempted between this provider and patient, however failed, due to patient having technical difficulties OR patient did not have access to video capability.  We continued and completed visit with audio only.   Persons Participating in Visit: Patient.  AWV Questionnaire: No: Patient Medicare AWV questionnaire was not completed prior to this visit.  Cardiac Risk Factors include: advanced age (>44men, >66 women)     Objective:     Today's Vitals   There is no height or weight on file to calculate BMI.     11/28/2023    4:14 PM 08/31/2023   10:34 AM 03/21/2023   11:42 AM 11/22/2022    4:05 PM 12/17/2021    8:53 AM 04/29/2020   10:36 AM 04/25/2019   11:08 AM  Advanced Directives  Does Patient Have a Medical Advance Directive? Yes Yes Yes Yes Yes Yes No  Type of Estate agent of [place];Living will Healthcare Power of [place];Living will Healthcare Power of  [place];Living will Healthcare Power of [place];Living will Healthcare Power of [place];Living will Healthcare Power of [place];Living will   Does patient want to make changes to medical advance directive?      Yes (Inpatient - patient defers changing a medical advance directive at this time - Information given)   Copy of Healthcare Power of Attorney in Chart? Yes - validated most recent copy scanned in chart (See row information)   Yes - validated most recent copy scanned in chart (See row information) Yes - validated most recent copy scanned in chart (See row information) Yes - validated most recent copy scanned in chart (See row information)   Would patient like information on creating a medical advance directive?       No - Patient declined    Current Medications (verified) Outpatient Encounter Medications as of 11/28/2023  Medication Sig   acetaminophen  (TYLENOL ) 500 MG tablet Take 500-1,000 mg by mouth as needed for moderate pain (pain score 4-6).   Ascorbic Acid (VITAMIN C) 1000 MG tablet Take 1,000 mg by mouth daily.   Biotin 5000 MCG CAPS Take 5,000 mcg by mouth daily.   bisoprolol  (ZEBETA ) 5 MG tablet Take 1 tablet by mouth daily   Calcium Carb-Cholecalciferol (CALCIUM 600 + D PO) Take 1 tablet by mouth daily.   Cholecalciferol (VITAMIN D) 125 MCG (5000 UT) CAPS Take 5,000 Units by mouth daily.   estradiol (CLIMARA - DOSED IN MG/24 HR) 0.1 mg/24hr patch Place 0.1 mg onto the skin once a week.   fluticasone  (FLONASE ) 50 MCG/ACT nasal spray Place 2 sprays into both nostrils  daily. (Patient taking differently: Place 2 sprays into both nostrils daily as needed for allergies.)   hydrocortisone cream 1 % Apply 1 Application topically daily as needed for itching.   ibuprofen (ADVIL) 200 MG tablet Take 200 mg by mouth every 6 (six) hours as needed.   loratadine (CLARITIN) 10 MG tablet Take 10 mg by mouth daily.   mometasone  (ELOCON ) 0.1 % cream Apply 1 Application topically daily as needed  (itching).   Polyethyl Glycol-Propyl Glycol (LUBRICATING EYE DROPS OP) Place 1 drop into both eyes 4 (four) times daily as needed (dry eyes).   pyridOXINE (VITAMIN B-6) 100 MG tablet Take 100 mg by mouth daily.   rivaroxaban  (XARELTO ) 20 MG TABS tablet TAKE 1 TABLET BY MOUTH ONCE DAILY WITH SUPPER   No facility-administered encounter medications on file as of 11/28/2023.    Allergies (verified) Sulfa antibiotics, Tetanus toxoid, and Aspirin   History: Past Medical History:  Diagnosis Date   Allergy    Asthma, mild intermittent 02/01/2016   Atrial fibrillation (HCC)    Past Surgical History:  Procedure Laterality Date   ABDOMINAL HYSTERECTOMY     ATRIAL FIBRILLATION ABLATION N/A 03/21/2023   Procedure: ATRIAL FIBRILLATION ABLATION;  Surgeon: Lei Pump, MD;  Location: [facility] INVASIVE CV LAB;  Service: Cardiovascular;  Laterality: N/A;   ATRIAL FIBRILLATION ABLATION N/A 08/31/2023   Procedure: ATRIAL FIBRILLATION ABLATION;  Surgeon: Lei Pump, MD;  Location: [facility] INVASIVE CV [facility];  Service: Cardiovascular;  Laterality: N/A;   CARDIOVERSION N/A 09/29/2022   Procedure: CARDIOVERSION;  Surgeon: Jann Melody, MD;  Location: [facility] INVAS[facility] CV [facility];  Service: Cardiovascular;  Laterality: N/A;   History reviewed. No pertinent family history. Social History   Socioeconomic History   Marital status: Single    Spouse name: Not on file   Number of children: Not on file   Years of education: Not on file   Highest education level: Not on file  Occupational History   Not on file  Tobacco Use   Smoking status: Former    Types: Cigarettes   Smokeless tobacco: Never   Tobacco comments:    Former smoker 09/12/22  Vaping Use   Vaping status: Never Used  Substance and Sexual Activity   Alcohol use: Yes    Alcohol/week: 1.0 standard drink of alcohol    Types: 1 Standard drinks or equivalent per week    Comment: 1 glass of wine/mixed drink 09/12/22   Drug use: No    Sexual activity: Not Currently  Other Topics Concern   Not on file  Social History Narrative   Not on file   Social Drivers of Health   Financial Resource Strain: Low Risk  (11/28/2023)   Overall Financial Resource Strain (CARDIA)    Difficulty of Paying Living Expenses: Not hard at all  Food Insecurity: No Food Insecurity (11/28/2023)   Hunger Vital Sign    Worried About Running Out of Food in the Last Year: Never true    Ran Out of Food in the Last Year: Never true  Transportation Needs: No Transportation Needs (11/28/2023)   PRAPARE - Administrator, Civil Service (Medical): No    Lack of Transportation (Non-Medical): No  Physical Activity: Sufficiently Active (11/28/2023)   Exercise Vital Sign    Days of Exercise per Week: 3 days    Minutes of Exercise per Session: 60 min  Stress: No Stress Concern Present (11/28/2023)   [employer] of Occupational Health - Occupational Stress Questionnaire  Feeling of Stress : Not at all  Social Connections: Unknown (11/28/2023)   Social Connection and Isolation Panel [NHANES]    Frequency of Communication with Friends and Family: More than three times a week    Frequency of Social Gatherings with Friends and Family: Twice a week    Attends Religious Services: More than 4 times per year    Active Member of [employer] or Organizations: Yes    Attends Engineer, structural: More than 4 times per year    Marital Status: Not on file    Tobacco Counseling Counseling given: Not Answered Tobacco comments: Former smoker 09/12/22    Clinical Intake:  Pre-visit preparation completed: Yes  Pain : No/denies pain     Nutritional Risks: None Diabetes: No  No results found for: "HGBA1C"   How often do you need to have someone help you when you read instructions, pamphlets, or other written materials from your doctor or pharmacy?: 1 - Never  Interpreter Needed?: No  Information entered by :: NAllen LPN   Activities  of Daily Living     11/28/2023    4:07 PM  In your present state of health, do you have any difficulty performing the following activities:  Hearing? 0  Vision? 0  Difficulty concentrating or making decisions? 0  Walking or climbing stairs? 0  Dressing or bathing? 0  Doing errands, shopping? 0  Preparing Food and eating ? N  Using the Toilet? N  In the past six months, have you accidently leaked urine? N  Do you have problems with loss of bowel control? N  Managing your Medications? N  Managing your Finances? N  Housekeeping or managing your Housekeeping? N    Patient Care Team: Watson Hacking, MD as PCP - General (Family Medicine) Thukkani, Arun K, MD as PCP - Cardiology (Cardiology) Lei Pump, MD as PCP - Electrophysiology (Cardiology)  I have updated your Care Teams any recent Medical Services you may have received from other providers in the past year.     Assessment:    This is a routine wellness examination for Alexandria [name].  Hearing/Vision screen Hearing Screening - Comments:: Denies hearing issues Vision Screening - Comments:: Regular eye exams, [facility]   Goals Addressed             This Visit's Progress    Patient Stated       11/28/2023, wants to lose weight and tone up       Depression Screen     11/28/2023    4:19 PM 07/20/2023    1:42 PM 11/22/2022    4:06 PM 06/02/2022    9:44 AM 12/17/2021    8:53 AM 05/03/2021   11:05 AM 04/29/2020   10:37 AM  PHQ 2/9 Scores  PHQ - 2 Score 0 1 0 0 0 0 1  PHQ- 9 Score 2  1  1       Fall Risk     11/28/2023    4:16 PM 07/20/2023    1:42 PM 11/22/2022    4:06 PM 08/15/2022    1:27 PM 06/02/2022    9:43 AM  Fall Risk   Falls in the past year? 0 0 0 0 0  Number falls in past yr: 0 0 0 0 0  Injury with Fall? 0 0 0 0 0  Risk for fall due to : Medication side effect  Medication side effect No Fall Risks No Fall Risks  Follow up Falls evaluation completed;Falls  prevention discussed  Falls prevention  discussed;Education provided;Falls evaluation completed Falls evaluation completed Falls evaluation completed    MEDICARE RISK AT HOME:  Medicare Risk at Home Any stairs in or around the home?: Yes If so, are there any without handrails?: No Home free of loose throw rugs in walkways, pet beds, electrical cords, etc?: Yes Adequate lighting in your home to reduce risk of falls?: Yes Life alert?: No Use of a cane, walker or w/c?: No Grab bars in the bathroom?: Yes Shower chair or bench in shower?: No Elevated toilet seat or a handicapped toilet?: Yes  TIMED UP AND GO:  Was the test performed?  No  Cognitive Function: 6CIT completed        11/28/2023    4:21 PM 11/22/2022    4:11 PM 12/17/2021    8:56 AM  6CIT Screen  What Year? 0 points 0 points 0 points  What month? 0 points 0 points 3 points  What time? 0 points 0 points 0 points  Count back from 20 0 points 0 points 0 points  Months in reverse 0 points 0 points 0 points  Repeat phrase 0 points 0 points 2 points  Total Score 0 points 0 points 5 points    Immunizations Immunization History  Administered Date(s) Administered   Fluad Quad(high Dose 65+) 03/20/2022   Influenza, High Dose Seasonal PF 04/06/2014, 03/30/2015, 03/19/2018, 03/23/2020, 04/27/2021, 04/04/2023   Influenza,inj,Quad PF,6+ Mos 03/15/2016   Influenza-Unspecified 04/11/2018, 03/15/2019   PFIZER(Purple Top)SARS-COV-2 Vaccination 07/09/2019, 07/29/2019, 04/29/2020   PNEUMOCOCCAL CONJUGATE-20 07/20/2023   Pneumococcal Conjugate-13 02/01/2016   Pneumococcal Polysaccharide-23 04/23/2018   Pneumococcal-Unspecified 04/11/2018   Respiratory Syncytial Virus Vaccine,Recomb Aduvanted(Arexvy) 03/07/2023   Zoster Recombinant(Shingrix) 03/02/2019, 05/06/2019    Screening Tests Health Maintenance  Topic Date Due   MAMMOGRAM  08/10/2023   DEXA SCAN  01/07/2024   INFLUENZA VACCINE  01/19/2024   Medicare Annual Wellness (AWV)  11/27/2024   Pneumonia Vaccine 57+  Years old  Completed   Hepatitis C Screening  Completed   Zoster Vaccines- Shingrix  Completed   HPV VACCINES  Aged Out   Meningococcal B Vaccine  Aged Out   DTaP/Tdap/Td  Discontinued   COVID-19 Vaccine  Discontinued   Fecal DNA (Cologuard)  Discontinued    Health Maintenance  Health Maintenance Due  Topic Date Due   MAMMOGRAM  08/10/2023   Health Maintenance Items Addressed: Allergy to Tetanus vaccine. Requesting mammogram report from [place].  Additional Screening:  Vision Screening: Recommended annual ophthalmology exams for early detection of glaucoma and other disorders of the eye. Would you like a referral to an eye doctor? No    Dental Screening: Recommended annual dental exams for proper oral hygiene  Community Resource Referral / Chronic Care Management: CRR required this visit?  No   CCM required this visit?  No   Plan:    I have personally reviewed and noted the following in the patient's chart:   Medical and social history Use of alcohol, tobacco or illicit drugs  Current medications and supplements including opioid prescriptions. Patient is not currently taking opioid prescriptions. Functional ability and status Nutritional status Physical activity Advanced directives List of other physicians Hospitalizations, surgeries, and ER visits in previous 12 months Vitals Screenings to include cognitive, depression, and falls Referrals and appointments  In addition, I have reviewed and discussed with patient certain preventive protocols, quality metrics, and best practice recommendations. A written personalized care plan for preventive services as well as general preventive health recommendations  were provided to patient.   Areatha Beecham, LPN   1/61/0960   After Visit Summary: (Pick Up) Due to this being a telephonic visit, with patients personalized plan was offered to patient and patient has requested to Pick up at office.  Notes: Nothing significant to  report at this time.

## 2023-11-28 NOTE — Patient Instructions (Signed)
 Ms. Ertle , Thank you for taking time out of your busy schedule to complete your Annual Wellness Visit with me. I enjoyed our conversation and look forward to speaking with you again next year. I, as well as your care team,  appreciate your ongoing commitment to your health goals. Please review the following plan we discussed and let me know if I can assist you in the future. Your Game plan/ To Do List    Referrals: If you haven't heard from the office you've been referred to, please reach out to them at the phone provided.  N/a Follow up Visits: Next Medicare AWV with our clinical staff: 12/03/2024 at 4:10    Have you seen your provider in the last 6 months (3 months if uncontrolled diabetes)? Yes Next Office Visit with your provider: 09/03/2024 at 11:15  Clinician Recommendations:  Aim for 30 minutes of exercise or brisk walking, 6-8 glasses of water, and 5 servings of fruits and vegetables each day.       This is a list of the screening recommended for you and due dates:  Health Maintenance  Topic Date Due   Mammogram  08/10/2023   DEXA scan (bone density measurement)  01/07/2024   Flu Shot  01/19/2024   Medicare Annual Wellness Visit  11/27/2024   Pneumonia Vaccine  Completed   Hepatitis C Screening  Completed   Zoster (Shingles) Vaccine  Completed   HPV Vaccine  Aged Out   Meningitis B Vaccine  Aged Out   DTaP/Tdap/Td vaccine  Discontinued   COVID-19 Vaccine  Discontinued   Cologuard (Stool DNA test)  Discontinued    Advanced directives: (In Chart) A copy of your advanced directives are scanned into your chart should your provider ever need it. Advance Care Planning is important because it:  [x]  Makes sure you receive the medical care that is consistent with your values, goals, and preferences  [x]  It provides guidance to your family and loved ones and reduces their decisional burden about whether or not they are making the right decisions based on your wishes.  Follow  the link provided in your after visit summary or read over the paperwork we have mailed to you to help you started getting your Advance Directives in place. If you need assistance in completing these, please reach out to us  so that we can help you!  See attachments for Preventive Care and Fall Prevention Tips.

## 2023-12-06 ENCOUNTER — Encounter: Payer: Self-pay | Admitting: Cardiology

## 2023-12-06 ENCOUNTER — Ambulatory Visit: Attending: Cardiology | Admitting: Cardiology

## 2023-12-06 VITALS — BP 120/74 | HR 53 | Ht 63.5 in | Wt 162.0 lb

## 2023-12-06 DIAGNOSIS — I4819 Other persistent atrial fibrillation: Secondary | ICD-10-CM

## 2023-12-06 DIAGNOSIS — D6869 Other thrombophilia: Secondary | ICD-10-CM | POA: Diagnosis not present

## 2023-12-06 NOTE — Progress Notes (Signed)
  Electrophysiology Office Note:   Date:  12/06/2023  ID:  Alexandria [name], DOB 1945-12-12, MRN [id number]  Primary Cardiologist: Kyra Phy, MD Primary Heart Failure: None Electrophysiologist: Romeka Scifres [place] Ding, MD      History of Present Illness:   Alexandria [name] is a 78 y.o. [name] with h/o atrial fibrillation seen today for routine electrophysiology follow-up s/p Ablation.  Since last being seen in our clinic the patient reports doing well.  She has no chest pain or shortness of breath.  Is able to do all of her daily activity without restriction.  She has not had any further episodes of atrial fibrillation.  She thinks that she may have more energy since ablation.  she denies chest pain, palpitations, dyspnea, PND, orthopnea, nausea, vomiting, dizziness, syncope, edema, weight gain, or early satiety.    Review of systems complete and found to be negative unless listed in HPI.   EP Information / Studies Reviewed:    EKG is ordered today. Personal review as below.  EKG Interpretation Date/Time:  Wednesday December 06 2023 16:37:52 EDT Ventricular Rate:  52 PR Interval:  148 QRS Duration:  86 QT Interval:  470 QTC Calculation: 437 R Axis:   77  Text Interpretation: Sinus bradycardia When compared with ECG of 28-Sep-2023 14:37, No significant change was found Confirmed by Rosaria Kubin ([id number]) on 12/06/2023 4:48:28 PM     Risk Assessment/Calculations:    CHA2DS2-VASc Score = 3   This indicates a 3.2% annual risk of stroke. The patient's score is based upon: CHF History: 0 HTN History: 0 Diabetes History: 0 Stroke History: 0 Vascular Disease History: 0 Age Score: 2 Gender Score: 1            Physical Exam:   VS:  BP 120/74 (BP Location: Left Arm, Patient Position: Sitting, Cuff Size: Normal)   Pulse (!) 53   Ht 5' 3.5 (1.613 m)   Wt 162 lb (73.5 kg)   SpO2 99%   BMI 28.25 kg/m    Wt Readings from Last 3 Encounters:  12/06/23 162 lb (73.5 kg)  11/08/23  166 lb 9.6 oz (75.6 kg)  10/18/23 165 lb 12.8 oz (75.2 kg)     GEN: Well nourished, well developed in no acute distress NECK: No JVD; No carotid bruits CARDIAC: Regular rate and rhythm, no murmurs, rubs, gallops RESPIRATORY:  Clear to auscultation without rales, wheezing or rhonchi  ABDOMEN: Soft, non-tender, non-distended EXTREMITIES:  No edema; No deformity   ASSESSMENT AND PLAN:    1.  Persistent atrial fibrillation: Post ablation 08/31/2023.  She remains in normal rhythm.  She has not had any further episodes of atrial fibrillation.  Tee Richeson stop bisoprolol  today.  2.  Secondary hypercoagulable state: On Xarelto   3.  Coronary artery disease: Mildly elevated.  Plan per primary cardiology.  Follow up with [facility] in 12 months  Signed, Mazikeen Hehn [place] Ding, MD

## 2024-02-26 DIAGNOSIS — N958 Other specified menopausal and perimenopausal disorders: Secondary | ICD-10-CM | POA: Diagnosis not present

## 2024-02-28 ENCOUNTER — Telehealth: Payer: Self-pay | Admitting: Cardiology

## 2024-02-28 NOTE — Telephone Encounter (Signed)
 Pt c/o swelling/edema: STAT if pt has developed SOB within 24 hours  If swelling, where is the swelling located? Foot   How much weight have you gained and in what time span? Not sure, states she doesn't think she has gained any   Have you gained 2 pounds in a day or 5 pounds in a week? not sure  Do you have a log of your daily weights (if so, list)? Does not record   Are you currently taking a fluid pill? No   Are you currently SOB? No   Have you traveled recently in a car or plane for an extended period of time? No   Pt states her PCP told her to get in contact with Dr. Inocencio about her foot swelling.

## 2024-02-28 NOTE — Telephone Encounter (Signed)
 Spoke with patient of Dr. Inocencio She has swelling of left foot - said she showed this to Dr. Inocencio back in June Patient states her swelling may have progressed since June She has swelling on dorsum of left foot and left ankle Saw Gyn on 9/8 - was advised she contact our office about swelling Reports no prior injury to left foot or ankle  Patient rests with her legs elevated She is on her feet often - very active  She has never taken a diuretic  She has no varicosities in LE Denies SOB No weight gain  No recent travel - but will be traveling about 4-6 hours this weekend in a car  Advised unlikely symptoms related to heart, give 09/2022 echo with LVEF 60-65% and unilateral symptoms  Advised will send to MD to look over notes and advise

## 2024-03-19 ENCOUNTER — Other Ambulatory Visit (HOSPITAL_COMMUNITY): Payer: Self-pay | Admitting: Physician Assistant

## 2024-03-19 ENCOUNTER — Other Ambulatory Visit: Payer: Self-pay | Admitting: Pulmonary Disease

## 2024-03-24 ENCOUNTER — Other Ambulatory Visit: Payer: Self-pay | Admitting: Internal Medicine

## 2024-03-24 DIAGNOSIS — I4891 Unspecified atrial fibrillation: Secondary | ICD-10-CM

## 2024-03-25 NOTE — Telephone Encounter (Signed)
 Prescription refill request for Xarelto  received.  Indication: PAF Last office visit: 12/06/23  Alexandria Norton MD Weight: 73.5kg Age: 78 Scr: 0.68 CrCl: 79.11  Based on above findings Xarelto  20 daily is the appropriate dose.  Refill approved.

## 2024-04-08 DIAGNOSIS — K080 Exfoliation of teeth due to systemic causes: Secondary | ICD-10-CM | POA: Diagnosis not present

## 2024-05-29 ENCOUNTER — Other Ambulatory Visit: Payer: Self-pay

## 2024-05-29 ENCOUNTER — Other Ambulatory Visit: Payer: Self-pay | Admitting: Family Medicine

## 2024-05-29 MED ORDER — FLUTICASONE PROPIONATE 50 MCG/ACT NA SUSP: 2.0000 | Freq: Every day | NASAL | 1 refills | Status: AC

## 2024-05-29 NOTE — Telephone Encounter (Signed)
 Copied from CRM #[id number]. Topic: Clinical - Medication Refill >> May 29, 2024 12:12 PM Wess S wrote: Medication: fluticasone  (FLONASE ) 50 MCG/ACT nasal spray   Has the patient contacted their pharmacy? Yes (Agent: If no, request that the patient contact the pharmacy for the refill. If patient does not wish to contact the pharmacy document the reason why and proceed with request.) (Agent: If yes, when and what did the pharmacy advise?) No refills  This is the patient's preferred pharmacy:  [facility] [address] [place] - [address] [place] [address] Phone: [phone] Fax: [phone]  Is this the correct pharmacy for this prescription? Yes If no, delete pharmacy and type the correct one.   Has the prescription been filled recently? Yes  Is the patient out of the medication? Yes  Has the patient been seen for an appointment in the last year OR does the patient have an upcoming appointment? Yes  Can we respond through MyChart? No  Agent: Please be advised that Rx refills may take up to 3 business days. We ask that you follow-up with your pharmacy.

## 2024-05-31 DIAGNOSIS — H52203 Unspecified astigmatism, bilateral: Secondary | ICD-10-CM | POA: Diagnosis not present

## 2024-06-17 ENCOUNTER — Ambulatory Visit

## 2024-06-18 ENCOUNTER — Ambulatory Visit (INDEPENDENT_AMBULATORY_CARE_PROVIDER_SITE_OTHER): Admitting: Family Medicine

## 2024-06-18 ENCOUNTER — Encounter: Payer: Self-pay | Admitting: Family Medicine

## 2024-06-18 VITALS — BP 134/82 | HR 68 | Temp 97.2°F | Wt 168.8 lb

## 2024-06-18 DIAGNOSIS — R059 Cough, unspecified: Secondary | ICD-10-CM

## 2024-06-18 DIAGNOSIS — J069 Acute upper respiratory infection, unspecified: Secondary | ICD-10-CM

## 2024-06-18 LAB — POC COVID19 BINAXNOW: SARS Coronavirus 2 Ag: NEGATIVE

## 2024-06-18 LAB — POCT INFLUENZA A/B
Influenza A, POC: NEGATIVE
Influenza B, POC: NEGATIVE

## 2024-06-18 MED ORDER — PREDNISONE 20 MG PO TABS: 40.0000 mg | ORAL_TABLET | Freq: Every day | ORAL | 0 refills | Status: AC

## 2024-06-18 MED ORDER — HYDROCODONE-ACETAMINOPHEN 7.5-325 MG/15ML PO SOLN: 15.0000 mL | Freq: Four times a day (QID) | ORAL | 0 refills | Status: AC | PRN

## 2024-06-18 NOTE — Progress Notes (Signed)
" ° °  Name: Alexandria [name]   Date of Visit: 06/18/2024   Date of last visit with me: Visit date not found   CHIEF COMPLAINT:  Chief Complaint  Patient presents with   other    Cough, congestion, SOB, wheezing, goes into her chest,        HPI:  Discussed the use of AI scribe software for clinical note transcription with the patient, who gave verbal consent to proceed.  History of Present Illness   Alexandria [name] is a 78 year old female who presents with wheezing and persistent cough.  She has been experiencing wheezing, persistent coughing, and nasal congestion since Saturday night after being around her three-year-old nephew who required urgent care. Although he did not test positive for RSV, she suspects she may have contracted an illness from him.  She experiences similar symptoms approximately twice a year, typically starting with a head cold that progresses to her chest, resulting in persistent coughing. The coughing is severe enough to cause rib pain, described as 'cough and cough and cough until my ribs hurt.'  She has a history of allergies and is not currently using her Flonase  and Claritin. The persistent cough disrupts her sleep despite using four pillows. She experiences 'rolling, coughing spells' that sometimes lead her to the bathroom, unsure if she will vomit mucus.  No positive tests for flu or COVID.         OBJECTIVE:       11/28/2023    4:19 PM  Depression screen PHQ 2/9  Decreased Interest 0  Down, Depressed, Hopeless 0  PHQ - 2 Score 0  Altered sleeping 2  Tired, decreased energy 0  Change in appetite 0  Feeling bad or failure about yourself  0  Trouble concentrating 0  Moving slowly or fidgety/restless 0  Suicidal thoughts 0  PHQ-9 Score 2   Difficult doing work/chores Not difficult at all     Data saved with a previous flowsheet row definition     BP Readings from Last 3 Encounters:  06/18/24 134/82  12/06/23 120/74  11/08/23 126/68     BP 134/82   Pulse 68   Temp (!) 97.2 F (36.2 C)   Wt 168 lb 12.8 oz (76.6 kg)   SpO2 98%   BMI 29.43 kg/m    Physical Exam   GENERAL: Temperature normal. CHEST: Expiratory wheeze present.      Physical Exam  ASSESSMENT/PLAN:   Assessment & Plan Cough, unspecified type  Viral URI with cough    Assessment and Plan    Acute upper respiratory infection Wheezing, persistent cough, and nasal congestion likely due to allergies causing allergic bronchospasm. Negative for flu and COVID. No pneumonia or significant airway obstruction. - Prescribed prednisone  for 7 days. - Prescribed cough syrup for symptomatic relief. - Advised continuation of Flonase  and Claritin daily. - Instructed to start medications upon pickup and follow up if symptoms do not improve by next week.         Kennet Mccort A. Vita MD [facility] "

## 2024-09-03 ENCOUNTER — Encounter: Payer: Medicare Other | Admitting: Family Medicine

## 2024-12-03 ENCOUNTER — Ambulatory Visit: Payer: Self-pay
